# Patient Record
Sex: Female | Born: 1955 | Race: White | Hispanic: No | Marital: Married | Smoking: Never smoker
Health system: Southern US, Community
[De-identification: ages and names within clinical notes are randomized; demographics above are authoritative.]

## PROBLEM LIST (undated history)

## (undated) DIAGNOSIS — F419 Anxiety disorder, unspecified: Secondary | ICD-10-CM

## (undated) DIAGNOSIS — E785 Hyperlipidemia, unspecified: Secondary | ICD-10-CM

## (undated) DIAGNOSIS — R251 Tremor, unspecified: Secondary | ICD-10-CM

## (undated) DIAGNOSIS — I10 Essential (primary) hypertension: Secondary | ICD-10-CM

## (undated) HISTORY — DX: Essential (primary) hypertension: I10

## (undated) HISTORY — PX: CHOLECYSTECTOMY: SHX55

## (undated) HISTORY — DX: Hyperlipidemia, unspecified: E78.5

## (undated) HISTORY — DX: Tremor, unspecified: R25.1

## (undated) HISTORY — DX: Anxiety disorder, unspecified: F41.9

## (undated) HISTORY — PX: DILATION AND CURETTAGE OF UTERUS: SHX78

---

## 2004-12-01 ENCOUNTER — Other Ambulatory Visit: Admission: RE | Admit: 2004-12-01 | Discharge: 2004-12-01 | Payer: Self-pay | Admitting: Obstetrics and Gynecology

## 2005-01-01 ENCOUNTER — Ambulatory Visit (HOSPITAL_COMMUNITY): Admission: RE | Admit: 2005-01-01 | Discharge: 2005-01-01 | Payer: Self-pay | Admitting: Obstetrics and Gynecology

## 2005-01-01 ENCOUNTER — Ambulatory Visit (HOSPITAL_BASED_OUTPATIENT_CLINIC_OR_DEPARTMENT_OTHER): Admission: RE | Admit: 2005-01-01 | Discharge: 2005-01-01 | Payer: Self-pay | Admitting: Obstetrics and Gynecology

## 2005-01-01 ENCOUNTER — Encounter (INDEPENDENT_AMBULATORY_CARE_PROVIDER_SITE_OTHER): Payer: Self-pay | Admitting: *Deleted

## 2006-11-23 HISTORY — PX: LAPAROSCOPIC HYSTERECTOMY: SHX1926

## 2007-06-28 ENCOUNTER — Other Ambulatory Visit: Admission: RE | Admit: 2007-06-28 | Discharge: 2007-06-28 | Payer: Self-pay | Admitting: Obstetrics and Gynecology

## 2007-07-09 ENCOUNTER — Ambulatory Visit (HOSPITAL_COMMUNITY): Admission: RE | Admit: 2007-07-09 | Discharge: 2007-07-09 | Payer: Self-pay | Admitting: Obstetrics and Gynecology

## 2007-07-09 ENCOUNTER — Encounter (INDEPENDENT_AMBULATORY_CARE_PROVIDER_SITE_OTHER): Payer: Self-pay | Admitting: Obstetrics and Gynecology

## 2008-09-04 ENCOUNTER — Other Ambulatory Visit: Admission: RE | Admit: 2008-09-04 | Discharge: 2008-09-04 | Payer: Self-pay | Admitting: Obstetrics and Gynecology

## 2009-02-21 ENCOUNTER — Encounter (INDEPENDENT_AMBULATORY_CARE_PROVIDER_SITE_OTHER): Payer: Self-pay | Admitting: Obstetrics and Gynecology

## 2009-02-21 ENCOUNTER — Ambulatory Visit (HOSPITAL_COMMUNITY): Admission: RE | Admit: 2009-02-21 | Discharge: 2009-02-22 | Payer: Self-pay | Admitting: Obstetrics and Gynecology

## 2009-04-09 ENCOUNTER — Ambulatory Visit: Payer: Self-pay

## 2011-03-04 LAB — CBC
MCHC: 34 g/dL (ref 30.0–36.0)
Platelets: 218 10*3/uL (ref 150–400)
RDW: 13.8 % (ref 11.5–15.5)

## 2011-03-04 LAB — TYPE AND SCREEN
ABO/RH(D): B POS
Antibody Screen: NEGATIVE

## 2011-03-05 LAB — HEPATIC FUNCTION PANEL
AST: 18 U/L (ref 0–37)
Albumin: 4 g/dL (ref 3.5–5.2)
Alkaline Phosphatase: 84 U/L (ref 39–117)
Total Bilirubin: 0.5 mg/dL (ref 0.3–1.2)
Total Protein: 6.7 g/dL (ref 6.0–8.3)

## 2011-03-05 LAB — BASIC METABOLIC PANEL
Calcium: 9.8 mg/dL (ref 8.4–10.5)
Creatinine, Ser: 0.94 mg/dL (ref 0.4–1.2)
GFR calc Af Amer: >60 mL/min (ref >60)
GFR calc non Af Amer: >60 mL/min (ref >60)
Sodium: 135 mEq/L (ref 135–145)

## 2011-03-05 LAB — CBC
Hemoglobin: 13.8 g/dL (ref 12.0–15.0)
RBC: 4.32 MIL/uL (ref 3.87–5.11)

## 2011-04-07 NOTE — Op Note (Signed)
NAME:  Savannah Simon, Savannah Simon                ACCOUNT NO.:  [id number]   MEDICAL RECORD NO.:  [id number]          PATIENT TYPE:  AMB   LOCATION:  SDC                           FACILITY:  [facility]   PHYSICIAN:  Charles A. Delcambre, MDDATE OF BIRTH:  [birth year]   DATE OF PROCEDURE:  07/09/2007  DATE OF DISCHARGE:                               OPERATIVE REPORT   PREOPERATIVE DIAGNOSES:  1. Menorrhagia.  2. Endometrial polyps.   POSTOPERATIVE DIAGNOSES:  1. Menorrhagia.  2. Endometrial polyps.   PROCEDURE:  1. Hysteroscopy.  2. Dilatation and curettage.  3. Polypectomy.  4. Paracervical block.   SURGEON:  Charles A. Sydnee Cabal, M.D.   ASSISTANT:  None.   COMPLICATIONS:  None.   ESTIMATED BLOOD LOSS:  Less than 10 mL.   FINDINGS:  Sound to 8 cm.  Multiple small endometrial polyps noted.  Otherwise __________ cavity.   SPECIMENS:  1. Endometrial polyps.  2. Endometrial curettings.   ANESTHESIA:  General by the laryngeal route.   COUNTS:  Instrument, sponge and needle counts correct x2. General by the  laryngeal route.   FLUIDS LOSS:  Sorbitol loss 200 mL although I would question this.  Some  of this was lost on the floor as the procedure was very brief.   DESCRIPTION OF PROCEDURE:  The patient was taken to the operating room,  placed in the supine position and general anesthetic was given.  She was  then placed in dorsal lithotomy position in Universal stirrups.  Sterile  prep and drape was undertaken.  Anterior lip the cervix was grasped with  a single-tooth tenaculum and cervix passed down to the vaginal introitus  and would not hold a weighted speculum.  There was adequate  visualization at the introitus with the cervix and tenaculum.  Sound was  to 8 cm.  Hanks dilators were used to dilate enough to pass the small 3-  mm scope.  The pelvioscopy was undertaken.  Polyp forceps used to grasp  some small amounts of tissue consistent with polyps, mainly from the  right anterior  lateral wall.  Generalized curetting yielded small amount  of tissue.  There is no evidence of perforation.  The patient tolerated  procedure well.  Tenaculum removed.  Hemostasis was adequate.  She was  given Toradol 30 mg IV, awakened and taken to recovery with physician in  attendance.      Charles A. Sydnee Cabal, MD  Electronically Signed     CAD/MEDQ  D:  07/09/2007  T:  07/10/2007  Job:  [id number]

## 2011-04-07 NOTE — Op Note (Signed)
NAME:  Savannah Simon, Savannah Simon                ACCOUNT NO.:  [id number]   MEDICAL RECORD NO.:  [id number]          PATIENT TYPE:  OIB   LOCATION:  9307                          FACILITY:  [facility]   PHYSICIAN:  Charles A. 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   PROCEDURE:  Laparoscopic-assisted vaginal hysterectomy with bilateral  salpingo-oophorectomy.   SURGEON:  Charles A. Sydnee Cabal, MD   ASSISTANT:  Gerald Leitz, MD.   COMPLICATIONS:  None.   ESTIMATED BLOOD LOSS:  Less than 100 mL.   FINDINGS:  Right ovarian cyst, approximately 3 cm (post menopausal).  Remainder of anatomy normal.   SPECIMEN:  Uterus, tubes, and ovaries to Pathology.   COMPLICATIONS:  None.   Instrument, sponge, and needle count correct x2.   ANESTHESIA:  General by the endotracheal route.   DESCRIPTION OF PROCEDURE:  The patient was taken to the operating room,  placed supine position and anesthesia was given.  She was then placed in  dorsal lithotomy position.  Sterile prep and drape was undertaken.  Cannula was placed on the cervix for uterine manipulation during the  case.  Attention was turned to the abdomen.  A 1-cm incision was made at  the umbilicus after 0.25% Marcaine plain was injected.  With anterior  traction on the abdominal wall, Veress needle was placed without  difficulty or complication.  Aspiration, injection, re-aspiration, and  hanging drop test all indicated intraperitoneal location, pressures less  than 8 mmHg at 4 L per minute high flow all indicated intraperitoneal  location as well, with adequate pneumoperitoneum established at 3 L,  Veress needle was removed.  A 10-mm port was  placed with anterior  traction on the abdominal wall without incident.  Scope was placed  immediately verifying proper placement of the scope and circumferential  view yielded no adhesions noted.  Under direct visualization, a second  port was placed midway down to the symphysis pubis and approximately 10  cm lateral.  Manipulator was placed through this port, steep  Trendelenburg was applied, and patient did have normal anatomy exposed.  A second lower trocar was then placed approximately midway and 10 cm  lateral on the left.  There was no damage to vascular structures, bowel,  or bladder.  Using the Enseal, the infundibulopelvic pedicles were taken  on either side and taken down to the round ligaments, round ligaments  were taken down to the peritoneal reflection of the bladder and the  bladder was taken across with the Enseal and Metzenbaum scissors.  Ureters were well seen easily on right and left and pedicles were  isolated well away from the ureters under clear visualization.  After  the cautery had been done, we were ready to go to the vaginal course of  the case, ureters were noted to be well away from pedicles once again  and peristalsing normally.  Desufflation was done and attention was  turned to the vagina.  Lahey clamps were placed on the cervix.  The  uterus was injected with 1% lidocaine with 1:200 epinephrine and a  scoring incision was made.  Bladder pillars were incised.  Ray-Tec was  packed into the area beneath the bladder to develop the bladder  mobilization off the lower uterine segment.  This did enter peritoneum  as expected and Ray-Tec was removed.  Retractor was placed in the space  and gastric epiploica were seen verifying anterior entry.  Posterior  colpotomy was done without difficulty.  A long weighted speculum was  placed in this space.  Uterosacral ligaments were taken with 0 Vicryl  transfixion stitches and held.  Uterine vessels were taken with   transfixion stitches and held.  Then, final pedicles up were taken to  complete the hysterectomy on either side.  The specimen was removed and  transfixed and stitches were used to close these pedicles.  Hemostasis  was excellent.  Cuff was closed with Richardson angle sutures on either  end and then running locking 0 Vicryl plus one additional figure-of-  eight suture to achieve hemostasis.  Cuff hemostasis was verified.  Attention was taken back to the abdomen.  Scope was replaced,  pneumoperitoneum was established, and the pelvis was dry.  Ureters were  again seen peristalsing on both sides.  Desufflation was allowed to  occur.  There was no bleeding at the trocar sites, low pressures.  All  trocars were removed.  Fascia was then closed at the umbilical area with  0 Vicryl interrupted stitch, 4-0 Vicryl was used to close the skin.  Hemostasis was good.  Hemostasis at the lower sites were excellent.  Dermabond was used to close these incisions.  The patient was awakened  and taken to recovery with physician in attendance having tolerated the  procedure well.      Charles A. Sydnee Cabal, MD  Electronically Signed     CAD/MEDQ  D:  02/21/2009  T:  02/21/2009  Job:  [id number]

## 2011-04-10 NOTE — Op Note (Signed)
NAME:  Savannah Simon, Savannah Simon                ACCOUNT NO.:  [id number]   MEDICAL RECORD NO.:  [id number]          PATIENT TYPE:  AMB   LOCATION:  NESC                         FACILITY:  [facility]   PHYSICIAN:  Daniel L. Gottsegen, M.D.DATE OF BIRTH:  10/23/1956   DATE OF PROCEDURE:  01/01/2005  DATE OF DISCHARGE:                                 OPERATIVE REPORT   PREOPERATIVE DIAGNOSIS:  Dysfunctional uterine bleeding, endometrial polyp.   POSTOPERATIVE DIAGNOSIS:  Dysfunctional uterine bleeding, endometrial polyp.   OPERATION:  Hysteroscopy with excision of endometrial polyp and endometrial  sampling.   SURGEON:  Daniel L. Eda Paschal, M.D.   ANESTHESIA:  General anesthesia.   INDICATIONS FOR PROCEDURE:  The patient is a 55 year old nulligravida who  came to see me because of menomenorrhagia.  She was treated with both  endometrial biopsy which was benign and Megace to stop the bleeding and then  she underwent an SIH.  SIH showed a 2 cm endometrial polyp and she now  enters the hospital for excision of the above.   DESCRIPTION OF PROCEDURE:  After adequate general anesthesia, the patient  was placed in the dorsal lithotomy position, prepped and draped in the usual  sterile manner.  A single-tooth tenaculum was placed in the anterior lip of  the cervix.  The cervix was dilated to #31 [facility] dilator.  Hysteroscopic  resectoscope was utilized, 3% Sorbitol was used to expand the intrauterine  cavity.  Camera was used for magnification.  A 90 degree wire loop set at 70  coag, 110 cutting, blend 1 was utilized.  The intrauterine cavity could  easily be entered and a very large sessile endometrial polyp was seen on the  posterior fundal wall.  This was excised with a 90 degree wire loop using  the above Bovie settings, took five or six different swipes to get the  entire polyp out. Bleeding was controlled with coagulation.  Endometrial  sampling was obtained.  Pictures were taken pre and post excision  of polyp.  At termination of the procedure, there was no bleeding noted.  Blood loss  was less than 50 mL.  Fluid deficit was between 50 and 100 mL.  The patient  tolerated the procedure well and left the operating room in satisfactory  condition.      DLG/MEDQ  D:  01/01/2005  T:  01/01/2005  Job:  [id number]

## 2011-09-07 LAB — BASIC METABOLIC PANEL
CO2: 28
Calcium: 9.6
Chloride: 99
Creatinine, Ser: 0.78
Glucose, Bld: 92
Sodium: 137

## 2011-09-07 LAB — CBC
Hemoglobin: 14.3
MCHC: 35.3
MCV: 92.6
RBC: 4.38
RDW: 12.7

## 2013-03-01 DIAGNOSIS — E663 Overweight: Secondary | ICD-10-CM | POA: Insufficient documentation

## 2013-03-23 DIAGNOSIS — K2100 Gastro-esophageal reflux disease with esophagitis, without bleeding: Secondary | ICD-10-CM | POA: Insufficient documentation

## 2013-10-12 ENCOUNTER — Ambulatory Visit: Payer: Self-pay | Admitting: Family Medicine

## 2013-10-17 ENCOUNTER — Ambulatory Visit: Payer: Self-pay

## 2013-10-25 ENCOUNTER — Ambulatory Visit: Payer: Self-pay

## 2013-11-28 ENCOUNTER — Ambulatory Visit: Payer: Self-pay

## 2013-12-11 ENCOUNTER — Ambulatory Visit: Payer: Self-pay | Admitting: Specialist

## 2014-01-23 ENCOUNTER — Ambulatory Visit: Payer: Self-pay | Admitting: Gastroenterology

## 2014-02-05 ENCOUNTER — Other Ambulatory Visit: Payer: Self-pay | Admitting: Gastroenterology

## 2014-04-20 ENCOUNTER — Ambulatory Visit: Payer: Self-pay | Admitting: Gastroenterology

## 2014-12-12 ENCOUNTER — Ambulatory Visit: Payer: Self-pay | Admitting: Internal Medicine

## 2014-12-24 DIAGNOSIS — K219 Gastro-esophageal reflux disease without esophagitis: Secondary | ICD-10-CM | POA: Insufficient documentation

## 2015-03-27 DIAGNOSIS — R0789 Other chest pain: Secondary | ICD-10-CM | POA: Insufficient documentation

## 2015-03-27 DIAGNOSIS — R739 Hyperglycemia, unspecified: Secondary | ICD-10-CM | POA: Insufficient documentation

## 2015-05-02 ENCOUNTER — Other Ambulatory Visit: Payer: Self-pay | Admitting: Specialist

## 2015-05-02 DIAGNOSIS — R918 Other nonspecific abnormal finding of lung field: Secondary | ICD-10-CM

## 2015-05-09 ENCOUNTER — Ambulatory Visit: Payer: BC Managed Care – PPO | Attending: Specialist

## 2015-06-07 ENCOUNTER — Ambulatory Visit
Admission: RE | Admit: 2015-06-07 | Discharge: 2015-06-07 | Disposition: A | Discharge status: Home / Self Care | Payer: BC Managed Care – PPO | Source: Ambulatory Visit | Attending: Specialist | Admitting: Specialist

## 2015-06-07 DIAGNOSIS — K760 Fatty (change of) liver, not elsewhere classified: Secondary | ICD-10-CM | POA: Diagnosis not present

## 2015-06-07 DIAGNOSIS — R911 Solitary pulmonary nodule: Secondary | ICD-10-CM | POA: Diagnosis present

## 2015-06-07 DIAGNOSIS — R918 Other nonspecific abnormal finding of lung field: Secondary | ICD-10-CM

## 2015-06-20 ENCOUNTER — Inpatient Hospital Stay: Payer: BC Managed Care – PPO | Attending: Cardiothoracic Surgery | Admitting: Cardiothoracic Surgery

## 2015-06-20 ENCOUNTER — Encounter: Payer: Self-pay | Admitting: Cardiothoracic Surgery

## 2015-06-20 VITALS — BP 160/85 | HR 85 | Temp 97.6°F | Ht <= 58 in | Wt 244.4 lb

## 2015-06-20 DIAGNOSIS — Z7689 Persons encountering health services in other specified circumstances: Secondary | ICD-10-CM

## 2015-06-20 DIAGNOSIS — R918 Other nonspecific abnormal finding of lung field: Secondary | ICD-10-CM | POA: Insufficient documentation

## 2015-06-20 DIAGNOSIS — Z0289 Encounter for other administrative examinations: Secondary | ICD-10-CM | POA: Diagnosis not present

## 2015-06-20 NOTE — Progress Notes (Signed)
Patient ID: Savannah Simon, female   DOB: 23-Feb-1956, 59 y.o.   MRN: [id number]  Chief Complaint  Patient presents with  . Lung Cancer    referral from [facility] for lung nodule    Referred By Dr. Raul Del Reason for Referral right lower lobe lung mass  HPI Location, Quality, Duration, Severity, Timing, Context, Modifying Factors, Associated Signs and Symptoms.  Savannah Simon is a 59 y.o. female.  I have personally seen and examined this patient. I have independently reviewed her x-rays including her CT scans.  This patient is a 59 year old white female who was born and raised in [place] and recently relocated to [place] after following her husband's employment opportunities. Her problems began about a year and a half ago when she experienced an episode of coughing for which she initially was diagnosed with a angiotensin-converting burning inhibitor cough.  A change in her hypertensive medications did not result in any significant improvement and she was ultimately referred to Dr. Raul Del after a chest x-ray confirmed the presence of a small pulmonary nodule. A subsequent CT scan confirmed the presence of calcified lymph nodes within the mediastinum as well as a calcified nodule in her left lower lobe. In the right lower lobe is a noncalcified 5-6 mm nodule which upon further follow-up has now increased to 8 mm over the last year and a half. The patient initially was started on Pulmicort and this did not really improve her symptoms. However she did notice that there was some mold the ceiling tiles at her place of work and these were replaced with some improvement in her symptoms. She had also been tried on famotidine which did not help her cough. Ultimately her cough has resolved and she has had no further problems. She states she only gets short of breath when she exercises a great deal in the heat. She does not complain of any weight loss fevers chills night sweats or other pulmonary symptoms. She was  complaining of some upper chest tightness and a stress test was performed which did not reveal any evidence of myocardial ischemia. She presents here today for follow-up of her lung nodule and surgical considerations.   Past Medical History  Diagnosis Date  . Hypertension     Past Surgical History  Procedure Laterality Date  . Cholecystectomy      No family history on file.  Social History History  Substance Use Topics  . Smoking status: Not on file  . Smokeless tobacco: Not on file  . Alcohol Use: Not on file    No Known Allergies  Current Outpatient Prescriptions  Medication Sig Dispense Refill  . famotidine (PEPCID) 40 MG tablet 40 mg.    . hydrochlorothiazide (HYDRODIURIL) 25 MG tablet 25 mg.    . metoprolol succinate (TOPROL-XL) 25 MG 24 hr tablet 25 mg.    . Diclofenac-Misoprostol 75-0.2 MG TBEC Take 75 mg by mouth.    . Multiple Vitamins-Minerals (CENTRUM ADULTS PO) Take by mouth.    . Omega-3 Fatty Acids (FISH OIL) 1000 MG CAPS Take 227 mg by mouth.     No current facility-administered medications for this visit.      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 She also complains of some swelling in her lower extremities particularly with [place] sedentary lifestyle but this resolves quickly with leg elevation and ambulation. She does complain of  some shortness of breath and the heat but otherwise is not short of breath.  Blood pressure 160/85, pulse 85, temperature 97.6 F (36.4 C), temperature source Tympanic, height 2' 1.59" (0.65 m), weight 244 lb 6.1 oz (110.85 kg), SpO2 98 %.  Physical Exam CONSTITUTIONAL:  Pleasant, well-developed, well-nourished, and in no acute distress. EYES: Pupils equal and reactive to light, Sclera non-icteric EARS, NOSE, MOUTH AND THROAT:  The oropharynx was clear.  Dentition is good  repair.  Oral mucosa pink and moist. LYMPH NODES:  Lymph nodes in the neck and axillae were normal RESPIRATORY:  Lungs were clear.  Normal respiratory effort without pathologic use of accessory muscles of respiration CARDIOVASCULAR: Heart was regular without murmurs.  There were no carotid bruits. GI: The abdomen was soft, nontender, and nondistended. There were no palpable masses. There was no hepatosplenomegaly. There were normal bowel sounds in all quadrants. GU:  Rectal deferred.   MUSCULOSKELETAL:  Normal muscle strength and tone.  No clubbing or cyanosis.   SKIN:  There were no pathologic skin lesions.  There were no nodules on palpation. NEUROLOGIC:  Sensation is normal.  Cranial nerves are grossly intact. PSYCH:  Oriented to person, place and time.  Mood and affect are normal.  Data Reviewed I have personally reviewed the CT scans chest x-rays  I have personally reviewed the patient's imaging, laboratory findings and medical records.    Assessment    The CT scan shows a noncalcified nodule in the right lower lobe which has increased in size from 5-6 mm to approximately 8 mm over the last year and a half. 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    Plan    I had a long discussion with the patient today regarding the options. I reviewed with her the options of repeat CT scan in 6 months, PET scan, fine-needle aspirate and surgical resection. We discussed the advantages and disadvantages of all these options. I told her that the only way to be absolute sure the diagnosis would be surgical resection. She is uncomfortable with that approach at the present time and would like to have additional follow-up performed in 6 months. She states that she is scheduled to follow-up with you in the next week or 2 and she will inform you to schedule the CT scan 22-year-old office in 6 months that she would like to continue her  follow-up with you. I thought this would be the best for her as you can also manage her other comorbid conditions. I did not make him an appointment for her at this point but be happy to see her again you think that would be necessary.       Nestor Lewandowsky, MD 06/20/2015, 9:56 AM

## 2015-06-21 ENCOUNTER — Encounter: Payer: Self-pay | Admitting: *Deleted

## 2015-06-21 NOTE — Progress Notes (Signed)
  Oncology Nurse Navigator Documentation    Navigator Encounter Type: Clinic/[facility]C (06/20/15 1547)               Met with patient at initial thoracic surgery appointment. Introduced Programmer, multimedia and reviewed plan of care. Will follow if needed. Plan for followup with pulmonary physician.

## 2015-11-22 IMAGING — CT CT CHEST W/O CM
2 of 4 series · 15 of 36 positions shown, 18 images · non-contrast
Comparison: 10/25/2013 chest CT.

CLINICAL DATA: Chronic cough for 1 year. Followup right lower lobe
pulmonary nodule.

EXAM:
CT CHEST WITHOUT CONTRAST
TECHNIQUE: Multidetector CT imaging of the chest was performed following the
standard protocol without IV contrast.

[Series 2: routine chest wo · axial · 0.78mm/px · z∈[-201,+94]mm · 12 of 71 slices shown, 15 images]
[im 6/71  mediastinal]
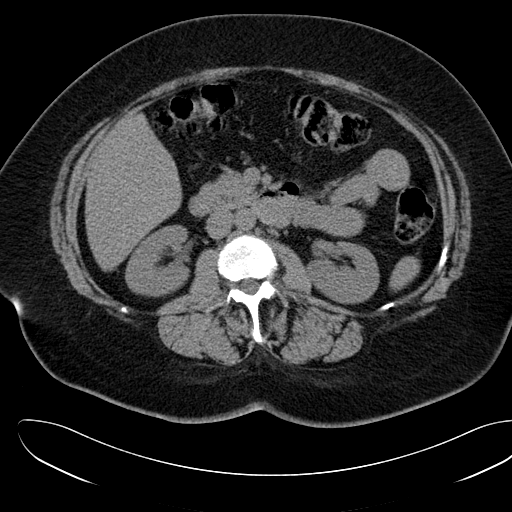
[im 6/71  lung]
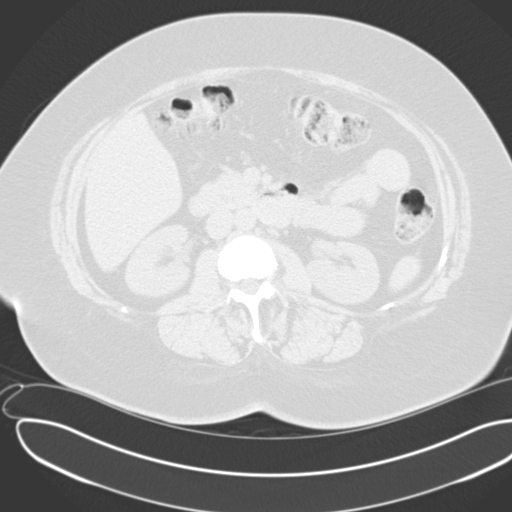
[im 11/71  lung]
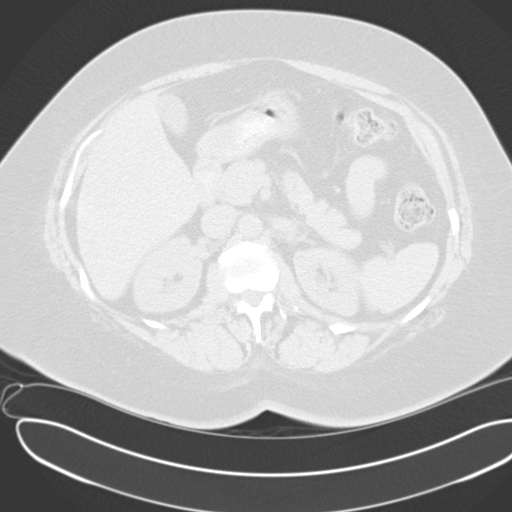
[im 17/71  lung]
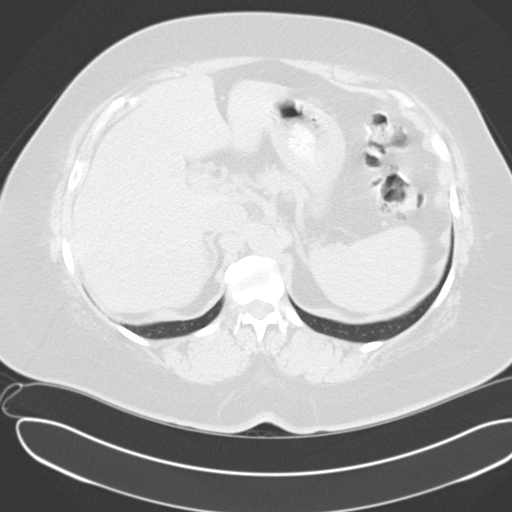
[im 22/71  lung]
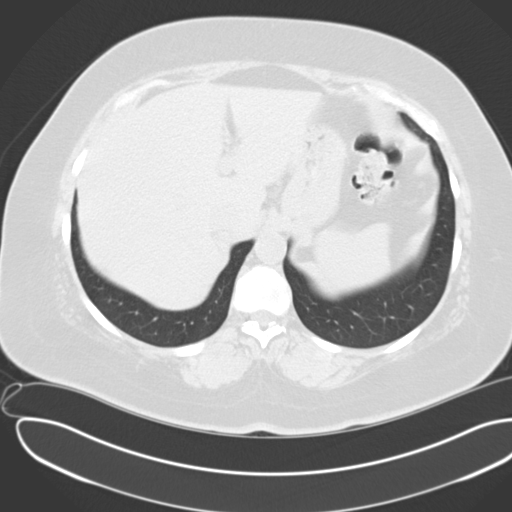
[im 27/71  mediastinal]
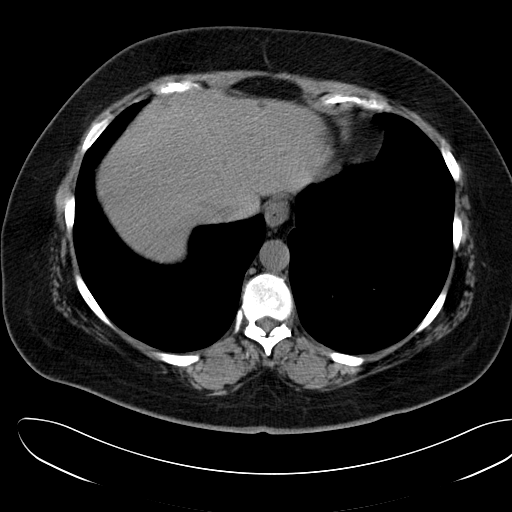
[im 27/71  lung]
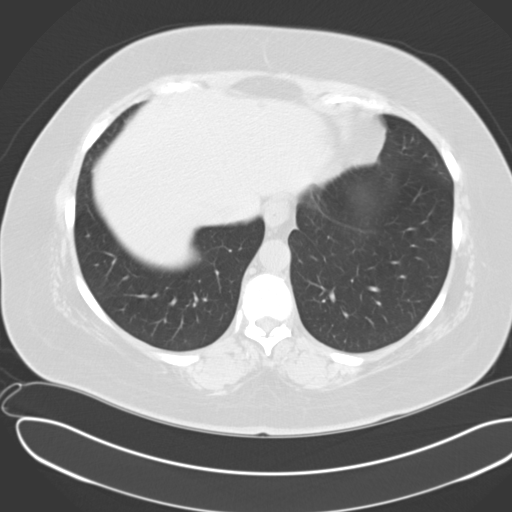
[im 33/71  lung]
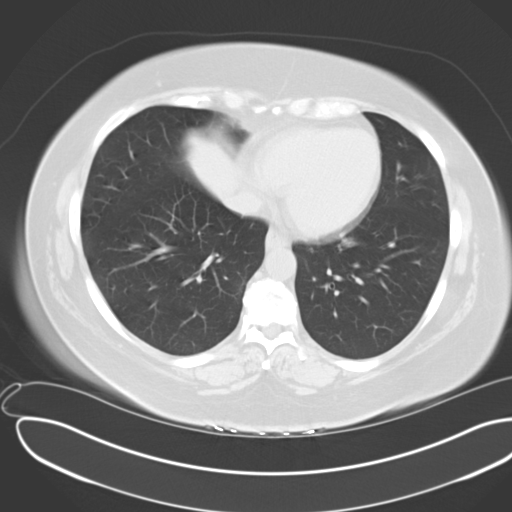
[im 38/71  lung]
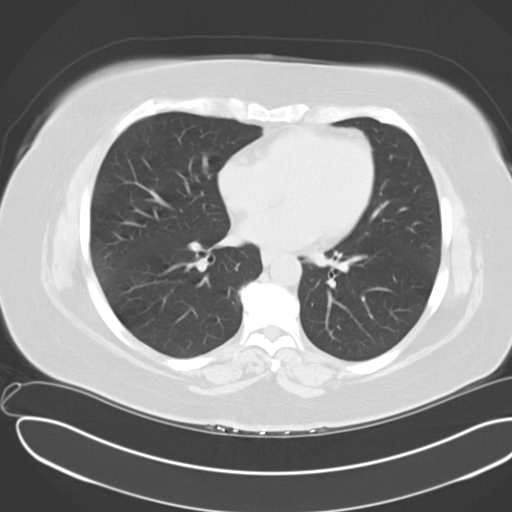
[im 44/71  lung]
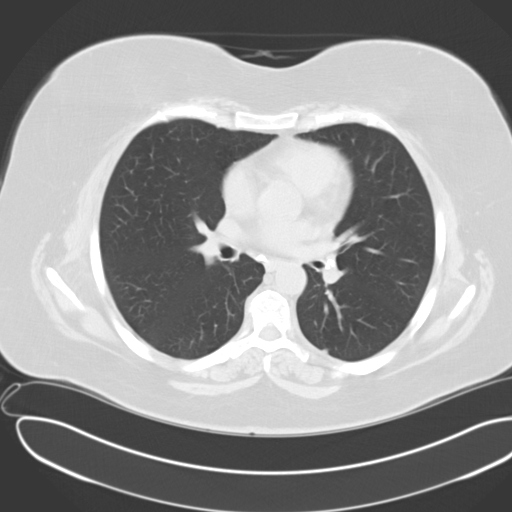
[im 49/71  mediastinal]
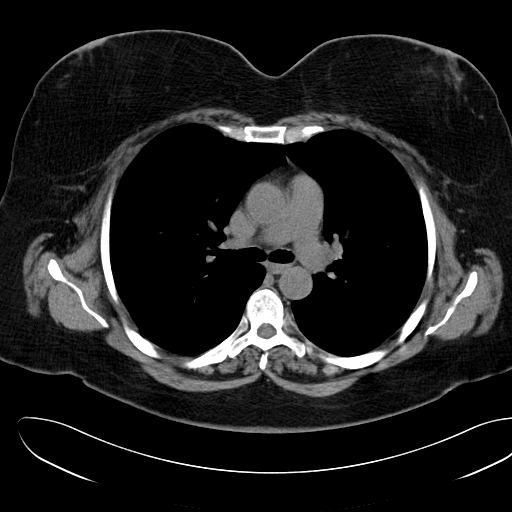
[im 49/71  lung]
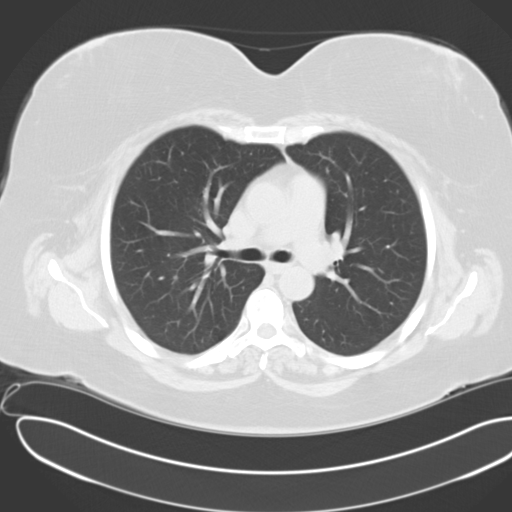
[im 54/71  lung]
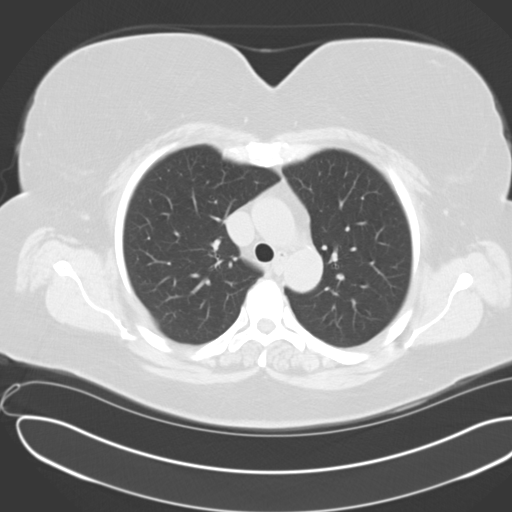
[im 60/71  lung]
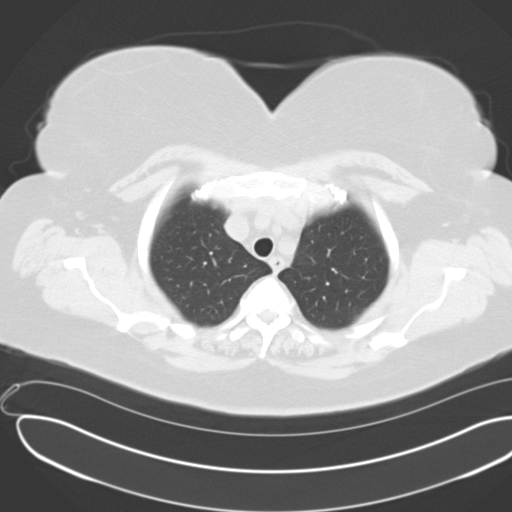
[im 65/71  lung]
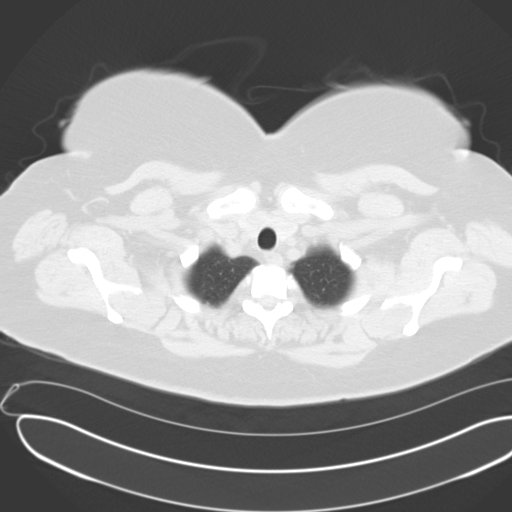

[Series 5: cor routine chest wo · coronal · 0.68mm/px · 3 of 126 slices shown]
[im 26/126  lung]
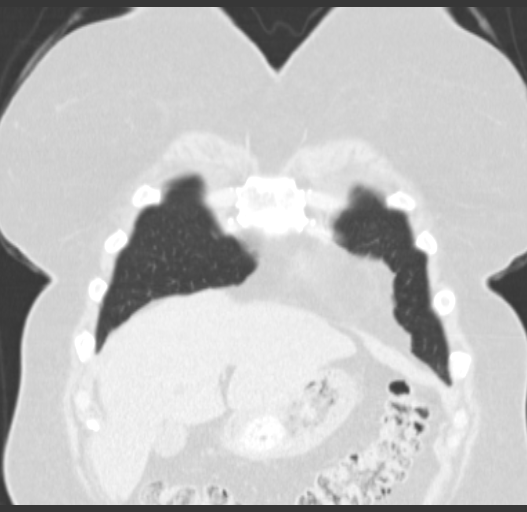
[im 51/126  lung]
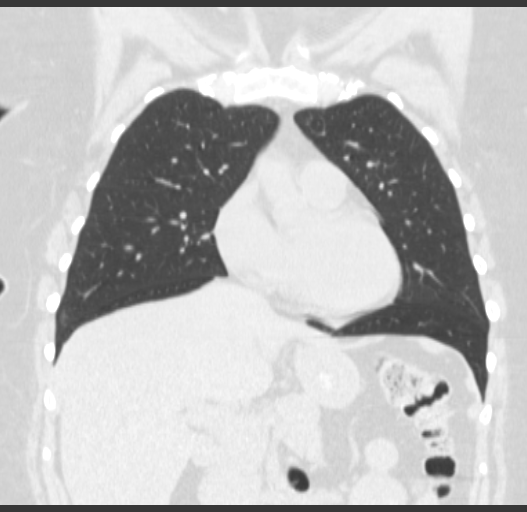
[im 76/126  lung]
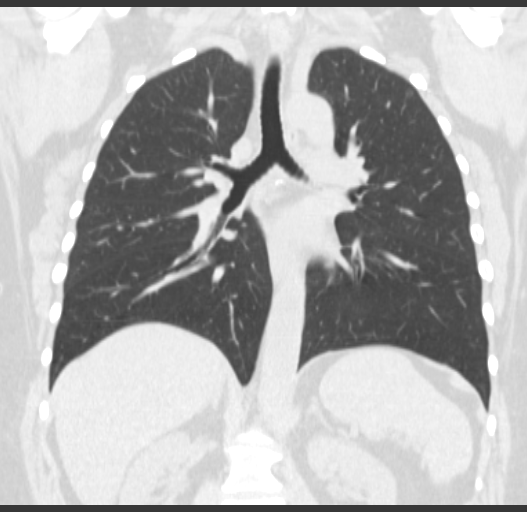

[15 of 36 positions shown; findings below may reference images not displayed]

FINDINGS: Wedge-shaped 3 mm right upper lobe pleural parenchymal scarring or
atelectasis image 22 incidentally noted. Stable 5 mm right lower
lobe pulmonary nodule image 37. There is a suggestion of possible
punctate calcification peripherally which could indicate a non
calcified granuloma. Calcified left lower lobe granuloma
reidentified. No new pulmonary consolidation or mass. No pleural or
pericardial effusion. 3 mm subpleural left lower lobe nodule image
28 is new from the prior exam. Central airways are patent.
Subcarinal and left hilar non enlarged calcified lymph nodes
reidentified. No new lymphadenopathy.

Thyroid is mildly inhomogeneous but otherwise unremarkable. Great
vessels are normal in caliber. Heart size is normal. Incomplete
imaging of the upper abdomen re- demonstrates normal-appearing
adrenal glands. Left hepatic lobe cysts reidentified. No acute
osseous abnormality. Disc degenerative change reidentified.
IMPRESSION: New subpleural 3 mm nodules as above, which could indicate
atelectasis or scarring but are amenable to followup at the time of
chest CT October 2014 for followup of stable right lower lobe 5 mm
pulmonary parenchymal nodule.

## 2015-12-30 ENCOUNTER — Other Ambulatory Visit: Payer: Self-pay | Admitting: Specialist

## 2015-12-30 DIAGNOSIS — R911 Solitary pulmonary nodule: Secondary | ICD-10-CM

## 2016-01-08 ENCOUNTER — Ambulatory Visit
Admission: RE | Admit: 2016-01-08 | Discharge: 2016-01-08 | Disposition: A | Discharge status: Home / Self Care | Payer: BC Managed Care – PPO | Source: Ambulatory Visit | Attending: Specialist | Admitting: Specialist

## 2016-01-08 DIAGNOSIS — K449 Diaphragmatic hernia without obstruction or gangrene: Secondary | ICD-10-CM | POA: Diagnosis not present

## 2016-01-08 DIAGNOSIS — K760 Fatty (change of) liver, not elsewhere classified: Secondary | ICD-10-CM | POA: Insufficient documentation

## 2016-01-08 DIAGNOSIS — R911 Solitary pulmonary nodule: Secondary | ICD-10-CM | POA: Diagnosis not present

## 2016-01-16 ENCOUNTER — Other Ambulatory Visit: Payer: Self-pay | Admitting: Specialist

## 2016-01-16 DIAGNOSIS — R911 Solitary pulmonary nodule: Secondary | ICD-10-CM

## 2016-07-07 ENCOUNTER — Ambulatory Visit
Admission: RE | Admit: 2016-07-07 | Discharge: 2016-07-07 | Disposition: A | Discharge status: Home / Self Care | Payer: BC Managed Care – PPO | Source: Ambulatory Visit | Attending: Specialist | Admitting: Specialist

## 2016-07-07 DIAGNOSIS — K449 Diaphragmatic hernia without obstruction or gangrene: Secondary | ICD-10-CM | POA: Insufficient documentation

## 2016-07-07 DIAGNOSIS — R911 Solitary pulmonary nodule: Secondary | ICD-10-CM | POA: Diagnosis present

## 2016-07-07 DIAGNOSIS — K760 Fatty (change of) liver, not elsewhere classified: Secondary | ICD-10-CM | POA: Diagnosis not present

## 2016-07-23 ENCOUNTER — Other Ambulatory Visit: Payer: Self-pay | Admitting: Internal Medicine

## 2016-07-23 DIAGNOSIS — F419 Anxiety disorder, unspecified: Secondary | ICD-10-CM | POA: Insufficient documentation

## 2016-07-23 DIAGNOSIS — Z1239 Encounter for other screening for malignant neoplasm of breast: Secondary | ICD-10-CM

## 2016-08-18 ENCOUNTER — Ambulatory Visit: Payer: BC Managed Care – PPO | Attending: Internal Medicine

## 2017-09-30 ENCOUNTER — Other Ambulatory Visit: Payer: Self-pay | Admitting: Internal Medicine

## 2017-09-30 DIAGNOSIS — F411 Generalized anxiety disorder: Secondary | ICD-10-CM | POA: Insufficient documentation

## 2017-09-30 DIAGNOSIS — G250 Essential tremor: Secondary | ICD-10-CM | POA: Insufficient documentation

## 2017-09-30 DIAGNOSIS — Z1231 Encounter for screening mammogram for malignant neoplasm of breast: Secondary | ICD-10-CM

## 2017-10-26 ENCOUNTER — Ambulatory Visit
Admission: RE | Admit: 2017-10-26 | Discharge: 2017-10-26 | Disposition: A | Discharge status: Home / Self Care | Payer: BC Managed Care – PPO | Source: Ambulatory Visit | Attending: Internal Medicine | Admitting: Internal Medicine

## 2017-10-26 DIAGNOSIS — Z1231 Encounter for screening mammogram for malignant neoplasm of breast: Secondary | ICD-10-CM | POA: Diagnosis present

## 2018-09-26 ENCOUNTER — Other Ambulatory Visit: Payer: Self-pay | Admitting: Internal Medicine

## 2018-09-26 DIAGNOSIS — Z1231 Encounter for screening mammogram for malignant neoplasm of breast: Secondary | ICD-10-CM

## 2018-10-11 ENCOUNTER — Encounter: Payer: Self-pay | Admitting: Family Medicine

## 2018-10-11 ENCOUNTER — Ambulatory Visit: Payer: BC Managed Care – PPO | Admitting: Family Medicine

## 2018-10-11 VITALS — BP 126/70 | HR 82 | Temp 98.3°F | Ht 65.0 in | Wt 253.0 lb

## 2018-10-11 DIAGNOSIS — Z7689 Persons encountering health services in other specified circumstances: Secondary | ICD-10-CM | POA: Diagnosis not present

## 2018-10-11 DIAGNOSIS — R600 Localized edema: Secondary | ICD-10-CM | POA: Diagnosis not present

## 2018-10-11 DIAGNOSIS — R7303 Prediabetes: Secondary | ICD-10-CM | POA: Diagnosis not present

## 2018-10-11 DIAGNOSIS — I10 Essential (primary) hypertension: Secondary | ICD-10-CM

## 2018-10-11 MED ORDER — LOSARTAN POTASSIUM 100 MG PO TABS: 100.0000 mg | ORAL_TABLET | Freq: Every day | ORAL | 3 refills | Status: DC

## 2018-10-11 NOTE — Progress Notes (Signed)
Patient presents to clinic today to establish care.  SUBJECTIVE: PMH: Pt is a 62 yo female with pmh sig for HTN, preDM.    HTN: -taking Norvasc 10 mg daily since 2015 -endorses LE edema, worse in evenings. -doing some walking at work -not eating much salt, given her husband's health  PreDM: -not on meds -changed diet.  Allergies: NKDA  Social hx: Pt is married.Marland Kitchen Her husband is also seen by this provider.  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  Pt and her husband live in [place] for part of the yr.  Pt denies tobacco and drug use.  Past Medical History:  Diagnosis Date  . Hypertension     Past Surgical History:  Procedure Laterality Date  . CHOLECYSTECTOMY      Current Outpatient Medications on File Prior to Visit  Medication Sig Dispense Refill  . famotidine (PEPCID) 40 MG tablet 40 mg.    . losartan-hydrochlorothiazide (HYZAAR) 50-12.5 MG tablet Take 1 tablet by mouth daily.    . metoprolol succinate (TOPROL-XL) 25 MG 24 hr tablet 25 mg.    . Multiple Vitamins-Minerals (CENTRUM ADULTS PO) Take by mouth.    . propranolol (INDERAL) 20 MG tablet Take 20 mg by mouth daily.     No current facility-administered medications on file prior to visit.     No Known Allergies  Family History  Problem Relation Age of Onset  . Cancer Mother 90       unknown primary     Social History   Socioeconomic History  . Marital status: Married    Spouse name: Not on file  . Number of children: Not on file  . Years of education: Not on file  . Highest education level: Not on file  Occupational History  . Not on file  Social Needs  . Financial resource strain: Not on file  . Food insecurity:    Worry: Not on file    Inability: Not on file  . Transportation needs:    Medical: Not on file    Non-medical: Not on file  Tobacco Use  . Smoking status: Not on file  Substance and Sexual Activity  . Alcohol use: Not on file  . Drug use: Not on  file  . Sexual activity: Not on file  Lifestyle  . Physical activity:    Days per week: Not on file    Minutes per session: Not on file  . Stress: Not on file  Relationships  . Social connections:    Talks on phone: Not on file    Gets together: Not on file    Attends religious service: Not on file    Active member of club or organization: Not on file    Attends meetings of clubs or organizations: Not on file    Relationship status: Not on file  . Intimate partner violence:    Fear of current or ex partner: Not on file    Emotionally abused: Not on file    Physically abused: Not on file    Forced sexual activity: Not on file  Other Topics Concern  . Not on file  Social History Narrative  . Not on file    ROS General: Denies fever, chills, night sweats, changes in weight, changes in appetite HEENT: Denies headaches, ear pain, changes in vision, rhinorrhea, sore throat CV: Denies CP, palpitations, SOB, orthopnea Pulm: Denies SOB, cough, wheezing GI: Denies abdominal pain, nausea, vomiting, diarrhea, constipation  GU: Denies dysuria, hematuria, frequency, vaginal discharge Msk: Denies muscle cramps, joint pains  +LE edema Neuro: Denies weakness, numbness, tingling Skin: Denies rashes, bruising Psych: Denies depression, anxiety, hallucinations  BP 126/70 (BP Location: Left Arm, Patient Position: Sitting, Cuff Size: Large)   Pulse 82   Temp 98.3 F (36.8 C) (Oral)   Ht 5\' 5"  (1.651 m)   Wt 253 lb (114.8 kg)   SpO2 97%   BMI 42.10 kg/m   Physical Exam Gen. Pleasant, well developed, well-nourished, in NAD HEENT - [place]/AT, PERRL, no scleral icterus, no nasal drainage, pharynx without erythema or exudate. Lungs: no use of accessory muscles, CTAB, no wheezes, rales or rhonchi Cardiovascular: RRR, no r/g/m, no peripheral edema Musculoskeletal: No deformities, moves all four extremities, no cyanosis or clubbing, normal tone Neuro:  A&Ox3, CN II-XII intact, normal gait Skin:   Warm, dry, intact, no lesions  No results found for this or any previous visit (from the past 2160 hour(s)).  Assessment/Plan: Essential hypertension  -controlled. -given increased LE edema discussed d/c'ing norvasc as may be contributing.  Pt in agrees to this plan - Plan: losartan (COZAAR) 100 MG tablet  Prediabetes -continue to monitor -lifestyle modifications  Bilateral lower extremity edema -discussed elevating LEs when sitting -compression socks or TED hose -will d/c norvasc at may be contributing -continue lifestyle modifications  Encounter to establish care -We reviewed the PMH, PSH, FH, SH, Meds and Allergies. -We provided refills for any medications we will prescribe as needed. -We addressed current concerns per orders and patient instructions. -We have asked for records for pertinent exams, studies, vaccines and notes from previous providers. -We have advised patient to follow up per instructions below.  F/u prn in 1 month for BP and edema, sooner if needed.  [name], MD

## 2018-10-11 NOTE — Patient Instructions (Addendum)
How to Take Your Blood Pressure You can take your blood pressure at home with a machine. You may need to check your blood pressure at home:  To check if you have high blood pressure (hypertension).  To check your blood pressure over time.  To make sure your blood pressure medicine is working.  Supplies needed: You will need a blood pressure machine, or monitor. You can buy one at a drugstore or online. When choosing one:  Choose one with an arm cuff.  Choose one that wraps around your upper arm. Only one finger should fit between your arm and the cuff.  Do not choose one that measures your blood pressure from your wrist or finger.  Your doctor can suggest a monitor. How to prepare Avoid these things for 30 minutes before checking your blood pressure:  Drinking caffeine.  Drinking alcohol.  Eating.  Smoking.  Exercising.  Five minutes before checking your blood pressure:  Pee.  Sit in a dining chair. Avoid sitting in a soft couch or armchair.  Be quiet. Do not talk.  How to take your blood pressure Follow the instructions that came with your machine. If you have a digital blood pressure monitor, these may be the instructions: 1. Sit up straight. 2. Place your feet on the floor. Do not cross your ankles or legs. 3. Rest your left arm at the level of your heart. You may rest it on a table, desk, or chair. 4. Pull up your shirt sleeve. 5. Wrap the blood pressure cuff around the upper part of your left arm. The cuff should be 1 inch (2.5 cm) above your elbow. It is best to wrap the cuff around bare skin. 6. Fit the cuff snugly around your arm. You should be able to place only one finger between the cuff and your arm. 7. Put the cord inside the groove of your elbow. 8. Press the power button. 9. Sit quietly while the cuff fills with air and loses air. 10. Write down the numbers on the screen. 11. Wait 2-3 minutes and then repeat steps 1-10.  What do the numbers  mean? Two numbers make up your blood pressure. The first number is called systolic pressure. The second is called diastolic pressure. An example of a blood pressure reading is "120 over 80" (or 120/80). If you are an adult and do not have a medical condition, use this guide to find out if your blood pressure is normal: Normal  First number: below 120.  Second number: below 80. Elevated  First number: 120-129.  Second number: below 80. Hypertension stage 1  First number: 130-139.  Second number: 80-89. Hypertension stage 2  First number: 140 or above.  Second number: 90 or above. Your blood pressure is above normal even if only the top or bottom number is above normal. Follow these instructions at home:  Check your blood pressure as often as your doctor tells you to.  Take your monitor to your next doctor's appointment. Your doctor will: ? Make sure you are using it correctly. ? Make sure it is working right.  Make sure you understand what your blood pressure numbers should be.  Tell your doctor if your medicines are causing side effects. Contact a doctor if:  Your blood pressure keeps being high. Get help right away if:  Your first blood pressure number is higher than 180.  Your second blood pressure number is higher than 120. This information is not intended to replace advice given   to you by your health care provider. Make sure you discuss any questions you have with your health care provider. Document Released: 10/22/2008 Document Revised: 10/07/2016 Document Reviewed: 04/17/2016 Elsevier Interactive Patient Education  2018 [employer].  Peripheral Edema Peripheral edema is swelling that is caused by a buildup of fluid. Peripheral edema most often affects the lower legs, ankles, and feet. It can also develop in the arms, hands, and face. The area of the body that has peripheral edema will look swollen. It may also feel heavy or warm. Your clothes may start to feel  tight. Pressing on the area may make a temporary dent in your skin. You may not be able to move your arm or leg as much as usual. There are many causes of peripheral edema. It can be a complication of other diseases, such as congestive heart failure, kidney disease, or a problem with your blood circulation. It also can be a side effect of certain medicines. It often happens to women during pregnancy. Sometimes, the cause is not known. Treating the underlying condition is often the only treatment for peripheral edema. Follow these instructions at home: Pay attention to any changes in your symptoms. Take these actions to help with your discomfort:  Raise (elevate) your legs while you are sitting or lying down.  Move around often to prevent stiffness and to lessen swelling. Do not sit or stand for long periods of time.  Wear support stockings as told by your health care provider.  Follow instructions from your health care provider about limiting salt (sodium) in your diet. Sometimes eating less salt can reduce swelling.  Take over-the-counter and prescription medicines only as told by your health care provider. Your health care provider may prescribe medicine to help your body get rid of excess water (diuretic).  Keep all follow-up visits as told by your health care provider. This is important.  Contact a health care provider if:  You have a fever.  Your edema starts suddenly or is getting worse, especially if you are pregnant or have a medical condition.  You have swelling in only one leg.  You have increased swelling and pain in your legs. Get help right away if:  You develop shortness of breath, especially when you are lying down.  You have pain in your chest or abdomen.  You feel weak.  You faint. This information is not intended to replace advice given to you by your health care provider. Make sure you discuss any questions you have with your health care provider. Document  Released: 12/17/2004 Document Revised: 04/13/2016 Document Reviewed: 05/22/2015 Elsevier Interactive Patient Education  2018 [employer].  Preventing Type 2 Diabetes Mellitus Type 2 diabetes (type 2 diabetes mellitus) is a long-term (chronic) disease that affects blood sugar (glucose) levels. Normally, a hormone called insulin allows glucose to enter cells in the body. The cells use glucose for energy. In type 2 diabetes, one or both of these problems may be present:  The body does not make enough insulin.  The body does not respond properly to insulin that it makes (insulin resistance).  Insulin resistance or lack of insulin causes excess glucose to build up in the blood instead of going into cells. As a result, high blood glucose (hyperglycemia) develops, which can cause many complications. Being overweight or obese and having an inactive (sedentary) lifestyle can increase your risk for diabetes. Type 2 diabetes can be delayed or prevented by making certain nutrition and lifestyle changes. What nutrition changes can  be made?  Eat healthy meals and snacks regularly. Keep a healthy snack with you for when you get hungry between meals, such as fruit or a handful of nuts.  Eat lean meats and proteins that are low in saturated fats, such as chicken, fish, egg whites, and beans. Avoid processed meats.  Eat plenty of fruits and vegetables and plenty of grains that have not been processed (whole grains). It is recommended that you eat: ? 1?2 cups of fruit every day. ? 2?3 cups of vegetables every day. ? 6?8 oz of whole grains every day, such as oats, whole wheat, bulgur, brown rice, quinoa, and millet.  Eat low-fat dairy products, such as milk, yogurt, and cheese.  Eat foods that contain healthy fats, such as nuts, avocado, olive oil, and canola oil.  Drink water throughout the day. Avoid drinks that contain added sugar, such as soda or sweet tea.  Follow instructions from your health care  provider about specific eating or drinking restrictions.  Control how much food you eat at a time (portion size). ? Check food labels to find out the serving sizes of foods. ? Use a kitchen scale to weigh amounts of foods.  Saute or steam food instead of frying it. Cook with water or broth instead of oils or butter.  Limit your intake of: ? Salt (sodium). Have no more than 1 tsp (2,400 mg) of sodium a day. If you have heart disease or high blood pressure, have less than ? tsp (1,500 mg) of sodium a day. ? Saturated fat. This is fat that is solid at room temperature, such as butter or fat on meat. What lifestyle changes can be made?  Activity  Do moderate-intensity physical activity for at least 30 minutes on at least 5 days of the week, or as much as told by your health care provider.  Ask your health care provider what activities are safe for you. A mix of physical activities may be best, such as walking, swimming, cycling, and strength training.  Try to add physical activity into your day. For example: ? Park in spots that are farther away than usual, so that you walk more. For example, park in a far corner of the parking lot when you go to the office or the grocery store. ? Take a walk during your lunch break. ? Use stairs instead of elevators or escalators. Weight Loss  Lose weight as directed. Your health care provider can determine how much weight loss is best for you and can help you lose weight safely.  If you are overweight or obese, you may be instructed to lose at least 5?7 % of your body weight. Alcohol and Tobacco   Limit alcohol intake to no more than 1 drink a day for nonpregnant women and 2 drinks a day for men. One drink equals 12 oz of beer, 5 oz of wine, or 1 oz of hard liquor.  Do not use any tobacco products, such as cigarettes, chewing tobacco, and e-cigarettes. If you need help quitting, ask your health care provider. Work With [facility]  Provider  Have your blood glucose tested regularly, as told by your health care provider.  Discuss your risk factors and how you can reduce your risk for diabetes.  Get screening tests as told by your health care provider. You may have screening tests regularly, especially if you have certain risk factors for type 2 diabetes.  Make an appointment with a diet and nutrition specialist (registered dietitian).  A registered dietitian can help you make a healthy eating plan and can help you understand portion sizes and food labels. Why are these changes important?  It is possible to prevent or delay type 2 diabetes and related health problems by making lifestyle and nutrition changes.  It can be difficult to recognize signs of type 2 diabetes. The best way to avoid possible damage to your body is to take actions to prevent the disease before you develop symptoms. What can happen if changes are not made?  Your blood glucose levels may keep increasing. Having high blood glucose for a long time is dangerous. Too much glucose in your blood can damage your blood vessels, heart, kidneys, nerves, and eyes.  You may develop prediabetes or type 2 diabetes. Type 2 diabetes can lead to many chronic health problems and complications, such as: ? Heart disease. ? Stroke. ? Blindness. ? Kidney disease. ? Depression. ? Poor circulation in the feet and legs, which could lead to surgical removal (amputation) in severe cases. Where to find support:  Ask your health care provider to recommend a registered dietitian, diabetes educator, or weight loss program.  Look for local or online weight loss groups.  Join a gym, fitness club, or outdoor activity group, such as a walking club. Where to find more information: To learn more about diabetes and diabetes prevention, visit:  American Diabetes Association (ADA): www.diabetes.[employer] of Diabetes and Digestive and Kidney Diseases:  FindSpin.nl  To learn more about healthy eating, visit:  The U.S. Department of Agriculture Scientist, research (physical sciences)), Choose My Plate: http://wiley-williams.com/  Office of Disease Prevention and Health Promotion (ODPHP), Dietary Guidelines: SurferLive.at  Summary  You can reduce your risk for type 2 diabetes by increasing your physical activity, eating healthy foods, and losing weight as directed.  Talk with your health care provider about your risk for type 2 diabetes. Ask about any blood tests or screening tests that you need to have. This information is not intended to replace advice given to you by your health care provider. Make sure you discuss any questions you have with your health care provider. Document Released: 03/02/2016 Document Revised: 04/16/2016 Document Reviewed: 12/31/2015 Elsevier Interactive Patient Education  [employer].

## 2018-10-14 ENCOUNTER — Encounter: Payer: Self-pay | Admitting: Family Medicine

## 2018-11-10 ENCOUNTER — Encounter: Payer: Self-pay | Admitting: Family Medicine

## 2018-11-10 ENCOUNTER — Ambulatory Visit: Payer: BC Managed Care – PPO | Admitting: Family Medicine

## 2018-11-10 VITALS — BP 110/64 | HR 70 | Temp 98.2°F | Wt 256.0 lb

## 2018-11-10 DIAGNOSIS — I10 Essential (primary) hypertension: Secondary | ICD-10-CM | POA: Diagnosis not present

## 2018-11-10 MED ORDER — LOSARTAN POTASSIUM 100 MG PO TABS: 100.0000 mg | ORAL_TABLET | Freq: Every day | ORAL | 3 refills | Status: DC

## 2018-11-10 NOTE — Progress Notes (Signed)
Subjective:    Patient ID: Savannah Simon, female    DOB: [birth year], 62 y.o.   MRN: [id number]  No chief complaint on file.   HPI Patient was seen today for follow-up on chronic conditions.  HTN: -taking losartan 100 mg daily,  HCTZ 12.5 -Was previously on Norvasc 10 mg which was d/c'd 2/2 LE edema  -pt notes improvement in edema -BP controlled at home -denies issues with new BP med  Past Medical History:  Diagnosis Date  . Hypertension     No Known Allergies  ROS General: Denies fever, chills, night sweats, changes in weight, changes in appetite HEENT: Denies headaches, ear pain, changes in vision, rhinorrhea, sore throat CV: Denies CP, palpitations, SOB, orthopnea Pulm: Denies SOB, cough, wheezing GI: Denies abdominal pain, nausea, vomiting, diarrhea, constipation GU: Denies dysuria, hematuria, frequency, vaginal discharge Msk: Denies muscle cramps, joint pains Neuro: Denies weakness, numbness, tingling Skin: Denies rashes, bruising Psych: Denies depression, anxiety, hallucinations     Objective:    Blood pressure 110/64, pulse 70, temperature 98.2 F (36.8 C), temperature source Oral, weight 256 lb (116.1 kg), SpO2 97 %.  Gen. Pleasant, well-nourished, in no distress, normal affect  HEENT: [place]/AT, face symmetric, no scleral icterus, PERRLA, nares patent without drainage, pharynx without erythema or exudate. Lungs: no accessory muscle use, CTAB, no wheezes or rales Cardiovascular: RRR, no m/r/g, no peripheral edema Neuro:  A&Ox3, CN II-XII intact, normal gait Skin:  Warm, no lesions/ rash   Wt Readings from Last 3 Encounters:  11/10/18 256 lb (116.1 kg)  10/11/18 253 lb (114.8 kg)  06/20/15 244 lb 6.1 oz (110.9 kg)    Lab Results  Component Value Date   WBC 11.7 (H) 02/22/2009   HGB 11.0 DELTA CHECK NOTED (L) 02/22/2009   HCT 32.2 (L) 02/22/2009   PLT 218 DELTA CHECK NOTED 02/22/2009   GLUCOSE 119 (H) 02/20/2009   ALT 17 02/20/2009   AST 18  02/20/2009   NA 135 02/20/2009   K 3.8 02/20/2009   CL 99 02/20/2009   CREATININE 0.94 02/20/2009   BUN 15 02/20/2009   CO2 26 02/20/2009    Assessment/Plan:  Essential hypertension  -controlled -continue current meds -will send 90 d supply of losartan to pharmacy - Plan: losartan (COZAAR) 100 MG tablet -continue checking bp at home -continue lifestyle modifications  F/u in 3 months  [name], MD

## 2018-11-11 ENCOUNTER — Encounter: Payer: Self-pay | Admitting: Family Medicine

## 2019-03-27 ENCOUNTER — Telehealth: Payer: Self-pay | Admitting: Family Medicine

## 2019-03-27 NOTE — Telephone Encounter (Signed)
Copied from [place] [id number]. Topic: Quick Communication - Rx Refill/Question >> Mar 27, 2019  8:32 AM Scherrie Gerlach wrote: Medication:  HYDROCHLOROTHIAZIDE  Pt was switched to this med and Dr Volanda Napoleon has never written this for the pt. [place], [place] - [address] [phone] (Phone) [phone] (Fax)

## 2019-03-28 ENCOUNTER — Other Ambulatory Visit: Payer: Self-pay

## 2019-03-28 MED ORDER — HYDROCHLOROTHIAZIDE 12.5 MG PO TABS: 12.5000 mg | ORAL_TABLET | Freq: Every day | ORAL | 1 refills | Status: DC

## 2019-03-28 NOTE — Telephone Encounter (Signed)
Rx sent to pt pharmacy 

## 2019-08-30 ENCOUNTER — Other Ambulatory Visit: Payer: Self-pay | Admitting: Family Medicine

## 2019-09-28 ENCOUNTER — Ambulatory Visit (INDEPENDENT_AMBULATORY_CARE_PROVIDER_SITE_OTHER): Payer: BC Managed Care – PPO | Admitting: Family Medicine

## 2019-09-28 ENCOUNTER — Encounter: Payer: Self-pay | Admitting: Family Medicine

## 2019-09-28 ENCOUNTER — Other Ambulatory Visit: Payer: Self-pay

## 2019-09-28 VITALS — BP 118/78 | HR 70 | Temp 98.0°F | Wt 254.0 lb

## 2019-09-28 DIAGNOSIS — R079 Chest pain, unspecified: Secondary | ICD-10-CM | POA: Diagnosis not present

## 2019-09-28 DIAGNOSIS — Z6841 Body Mass Index (BMI) 40.0 and over, adult: Secondary | ICD-10-CM | POA: Diagnosis not present

## 2019-09-28 DIAGNOSIS — I10 Essential (primary) hypertension: Secondary | ICD-10-CM | POA: Insufficient documentation

## 2019-09-28 DIAGNOSIS — Z0000 Encounter for general adult medical examination without abnormal findings: Secondary | ICD-10-CM

## 2019-09-28 DIAGNOSIS — Z23 Encounter for immunization: Secondary | ICD-10-CM | POA: Diagnosis not present

## 2019-09-28 DIAGNOSIS — Z0001 Encounter for general adult medical examination with abnormal findings: Secondary | ICD-10-CM | POA: Diagnosis not present

## 2019-09-28 DIAGNOSIS — Z1211 Encounter for screening for malignant neoplasm of colon: Secondary | ICD-10-CM

## 2019-09-28 DIAGNOSIS — N761 Subacute and chronic vaginitis: Secondary | ICD-10-CM

## 2019-09-28 LAB — TSH: TSH: 1.91 u[IU]/mL (ref 0.35–4.50)

## 2019-09-28 LAB — BASIC METABOLIC PANEL
BUN: 12 mg/dL (ref 6–23)
CO2: 30 mEq/L (ref 19–32)
Calcium: 9.8 mg/dL (ref 8.4–10.5)
Chloride: 101 mEq/L (ref 96–112)
Creatinine, Ser: 0.8 mg/dL (ref 0.40–1.20)
GFR: 72.6 mL/min (ref >60.00)
Glucose, Bld: 126 mg/dL — ABNORMAL HIGH (ref 70–99)
Potassium: 4 mEq/L (ref 3.5–5.1)
Sodium: 140 mEq/L (ref 135–145)

## 2019-09-28 LAB — CBC WITH DIFFERENTIAL/PLATELET
Basophils Absolute: 0 10*3/uL (ref 0.0–0.1)
Basophils Relative: 0.6 % (ref 0.0–3.0)
Eosinophils Absolute: 0.2 10*3/uL (ref 0.0–0.7)
Eosinophils Relative: 2.2 % (ref 0.0–5.0)
HCT: 41.8 % (ref 36.0–46.0)
Hemoglobin: 14.5 g/dL (ref 12.0–15.0)
Lymphocytes Relative: 30.4 % (ref 12.0–46.0)
Lymphs Abs: 2.3 10*3/uL (ref 0.7–4.0)
MCHC: 34.8 g/dL (ref 30.0–36.0)
MCV: 95.4 fl (ref 78.0–100.0)
Monocytes Absolute: 0.4 10*3/uL (ref 0.1–1.0)
Monocytes Relative: 4.8 % (ref 3.0–12.0)
Neutro Abs: 4.8 10*3/uL (ref 1.4–7.7)
Neutrophils Relative %: 62 % (ref 43.0–77.0)
Platelets: 277 10*3/uL (ref 150.0–400.0)
RBC: 4.39 Mil/uL (ref 3.87–5.11)
RDW: 12.4 % (ref 11.5–15.5)
WBC: 7.7 10*3/uL (ref 4.0–10.5)

## 2019-09-28 LAB — LIPID PANEL
Cholesterol: 199 mg/dL (ref 0–200)
HDL: 41.7 mg/dL (ref >39.00)
LDL Cholesterol: 130 mg/dL — ABNORMAL HIGH (ref 0–99)
NonHDL: 157.68
Total CHOL/HDL Ratio: 5
Triglycerides: 139 mg/dL (ref 0.0–149.0)
VLDL: 27.8 mg/dL (ref 0.0–40.0)

## 2019-09-28 LAB — T4, FREE: Free T4: 0.9 ng/dL (ref 0.60–1.60)

## 2019-09-28 LAB — HEMOGLOBIN A1C: Hgb A1c MFr Bld: 6.3 % (ref 4.6–6.5)

## 2019-09-28 NOTE — Progress Notes (Signed)
Subjective:     Savannah Simon is a 63 y.o. female and is here for a comprehensive physical exam. The patient reports problems - intermittent chest pain, possible yeast infection.  Pt with 3 episodes of chest tightness and pain since March that radiates to back and jaw.  Causes b/l UE numbness and tingling.  Worse with stress.  Pt has thought about calling EMS, but symptoms resolve by the time she goes to do it.   Pt endorses increased stress (her husband's health and work).  Pt also endorses weight gain from stress eating.  Denies n/v, changes in vision, SOB, dizziness.  Pt also with vaginal irritation and clear d/c x 1 mo.  States "feels damp".  Pt tried OTC yeast medication which helped briefly.    Needs pap, mammogram ,and colonoscopy.  Social History   Socioeconomic History  . Marital status: Married    Spouse name: Not on file  . Number of children: Not on file  . Years of education: Not on file  . Highest education level: Not on file  Occupational History  . Not on file  Social Needs  . Financial resource strain: Not on file  . Food insecurity    Worry: Not on file    Inability: Not on file  . Transportation needs    Medical: Not on file    Non-medical: Not on file  Tobacco Use  . Smoking status: Never Smoker  . Smokeless tobacco: Never Used  Substance and Sexual Activity  . Alcohol use: Yes  . Drug use: Never  . Sexual activity: Not Currently  Lifestyle  . Physical activity    Days per week: Not on file    Minutes per session: Not on file  . Stress: Not on file  Relationships  . Social Herbalist on phone: Not on file    Gets together: Not on file    Attends religious service: Not on file    Active member of club or organization: Not on file    Attends meetings of clubs or organizations: Not on file    Relationship status: Not on file  . Intimate partner violence    Fear of current or ex partner: Not on file    Emotionally abused: Not on file   Physically abused: Not on file    Forced sexual activity: Not on file  Other Topics Concern  . Not on file  Social History Narrative  . Not on file   Health Maintenance  Topic Date Due  . Hepatitis C Screening  [birth year]  . HIV Screening  05/21/1972  . PAP SMEAR-Modifier  05/21/1978  . COLONOSCOPY  05/22/2007  . INFLUENZA VACCINE  06/24/2019  . MAMMOGRAM  10/27/2019  . TETANUS/TDAP  07/23/2026    The following portions of the patient's history were reviewed and updated as appropriate: allergies, current medications, past family history, past medical history, past social history, past surgical history and problem list.  Review of Systems Pertinent items noted in HPI and remainder of comprehensive ROS otherwise negative.   Objective:    BP 118/78 (BP Location: Left Arm, Patient Position: Sitting, Cuff Size: Large)   Pulse 70   Temp 98 F (36.7 C) (Oral)   Wt 254 lb (115.2 kg)   SpO2 98%   BMI 42.27 kg/m  General appearance: alert, cooperative and no distress Head: Normocephalic, without obvious abnormality, atraumatic Eyes: conjunctivae/corneas clear. PERRL, EOM's intact. Fundi benign. Ears: normal TM's and external ear  canals both ears Nose: Nares normal. Septum midline. Mucosa normal. No drainage or sinus tenderness. Throat: lips, mucosa, and tongue normal; teeth and gums normal Neck: no adenopathy, no carotid bruit, no JVD, supple, symmetrical, trachea midline and thyroid not enlarged, symmetric, no tenderness/mass/nodules Lungs: clear to auscultation bilaterally Heart: regular rate and rhythm, S1, S2 normal, no murmur, click, rub or gallop Abdomen: soft, non-tender; bowel sounds normal; no masses,  no organomegaly Extremities: extremities normal, atraumatic, no cyanosis or edema Pulses: 2+ and symmetric Skin: Skin color, texture, turgor normal. No rashes or lesions Lymph nodes: Cervical, supraclavicular, and axillary nodes normal. Neurologic: Alert and oriented X 3,  normal strength and tone. Normal symmetric reflexes. Normal coordination and gait    Assessment:    63 yo female exam with episodes of chest pain and vaginitis.      Plan:     Anticipatory guidance given including wearing seatbelts, smoke detectors in the home, increasing physical activity, increasing p.o. intake of water and vegetables. -will obtain labs -mammogram due.  Given info to schedule -pap due, will schedule in the next few wks. -colonoscopy due.  Referral placed. -influenza vaccine given this visit. -given handout -next CPE in 1 yr. See After Visit Summary for Counseling Recommendations    Chest pain, unspecified type  -likely 2/2 increased stress.   -normal EKG.  NSR, no ST elevation or T wave inversion noted. -referral to Cards for stress test. - Plan: TSH, T4, Free, Lipid Panel, EKG 12-Lead, Ambulatory referral to Cardiology  Essential hypertension  -controlled -continue losartan 100 mg, HCTZ 12.5 mg daily - Plan: Basic Metabolic Panel  Class 3 severe obesity due to excess calories without serious comorbidity with body mass index (BMI) of 40.0 to 44.9 in adult [facility])  -discussed increasing physical activity and limiting stress eating. - Plan: Hemoglobin A1c, Lipid Panel  Subacute vaginitis  Colon cancer screening  - Plan: Ambulatory referral to Gastroenterology  [name], MD

## 2019-09-28 NOTE — Patient Instructions (Signed)
Preventive Care 40-62 Years Old, Female Preventive care refers to visits with your health care provider and lifestyle choices that can promote health and wellness. This includes:  A yearly physical exam. This may also be called an annual well check.  Regular dental visits and eye exams.  Immunizations.  Screening for certain conditions.  Healthy lifestyle choices, such as eating a healthy diet, getting regular exercise, not using drugs or products that contain nicotine and tobacco, and limiting alcohol use. What can I expect for my preventive care visit? Physical exam Your health care provider will check your:  Height and weight. This may be used to calculate body mass index (BMI), which tells if you are at a healthy weight.  Heart rate and blood pressure.  Skin for abnormal spots. Counseling Your health care provider may ask you questions about your:  Alcohol, tobacco, and drug use.  Emotional well-being.  Home and relationship well-being.  Sexual activity.  Eating habits.  Work and work environment.  Method of birth control.  Menstrual cycle.  Pregnancy history. What immunizations do I need?  Influenza (flu) vaccine  This is recommended every year. Tetanus, diphtheria, and pertussis (Tdap) vaccine  You may need a Td booster every 10 years. Varicella (chickenpox) vaccine  You may need this if you have not been vaccinated. Zoster (shingles) vaccine  You may need this after age 60. Measles, mumps, and rubella (MMR) vaccine  You may need at least one dose of MMR if you were born in 1957 or later. You may also need a second dose. Pneumococcal conjugate (PCV13) vaccine  You may need this if you have certain conditions and were not previously vaccinated. Pneumococcal polysaccharide (PPSV23) vaccine  You may need one or two doses if you smoke cigarettes or if you have certain conditions. Meningococcal conjugate (MenACWY) vaccine  You may need this if you  have certain conditions. Hepatitis A vaccine  You may need this if you have certain conditions or if you travel or work in places where you may be exposed to hepatitis A. Hepatitis B vaccine  You may need this if you have certain conditions or if you travel or work in places where you may be exposed to hepatitis B. Haemophilus influenzae type b (Hib) vaccine  You may need this if you have certain conditions. Human papillomavirus (HPV) vaccine  If recommended by your health care provider, you may need three doses over 6 months. You may receive vaccines as individual doses or as more than one vaccine together in one shot (combination vaccines). Talk with your health care provider about the risks and benefits of combination vaccines. What tests do I need? Blood tests  Lipid and cholesterol levels. These may be checked every 5 years, or more frequently if you are over 50 years old.  Hepatitis C test.  Hepatitis B test. Screening  Lung cancer screening. You may have this screening every year starting at age 55 if you have a 30-pack-year history of smoking and currently smoke or have quit within the past 15 years.  Colorectal cancer screening. All adults should have this screening starting at age 50 and continuing until age 75. Your health care provider may recommend screening at age 45 if you are at increased risk. You will have tests every 1-10 years, depending on your results and the type of screening test.  Diabetes screening. This is done by checking your blood sugar (glucose) after you have not eaten for a while (fasting). You may have this   done every 1-3 years.  Mammogram. This may be done every 1-2 years. Talk with your health care provider about when you should start having regular mammograms. This may depend on whether you have a family history of breast cancer.  BRCA-related cancer screening. This may be done if you have a family history of breast, ovarian, tubal, or peritoneal  cancers.  Pelvic exam and Pap test. This may be done every 3 years starting at age 3. Starting at age 57, this may be done every 5 years if you have a Pap test in combination with an HPV test. Other tests  Sexually transmitted disease (STD) testing.  Bone density scan. This is done to screen for osteoporosis. You may have this scan if you are at high risk for osteoporosis. Follow these instructions at home: Eating and drinking  Eat a diet that includes fresh fruits and vegetables, whole grains, lean protein, and low-fat dairy.  Take vitamin and mineral supplements as recommended by your health care provider.  Do not drink alcohol if: ? Your health care provider tells you not to drink. ? You are pregnant, may be pregnant, or are planning to become pregnant.  If you drink alcohol: ? Limit how much you have to 0-1 drink a day. ? Be aware of how much alcohol is in your drink. In the [place]., one drink equals one 12 oz bottle of beer (355 mL), one 5 oz glass of wine (148 mL), or one 1 oz glass of hard liquor (44 mL). Lifestyle  Take daily care of your teeth and gums.  Stay active. Exercise for at least 30 minutes on 5 or more days each week.  Do not use any products that contain nicotine or tobacco, such as cigarettes, e-cigarettes, and chewing tobacco. If you need help quitting, ask your health care provider.  If you are sexually active, practice safe sex. Use a condom or other form of birth control (contraception) in order to prevent pregnancy and STIs (sexually transmitted infections).  If told by your health care provider, take low-dose aspirin daily starting at age 23. What's next?  Visit your health care provider once a year for a well check visit.  Ask your health care provider how often you should have your eyes and teeth checked.  Stay up to date on all vaccines. This information is not intended to replace advice given to you by your health care provider. Make sure you  discuss any questions you have with your health care provider. Document Released: 12/06/2015 Document Revised: 07/21/2018 Document Reviewed: 07/21/2018 Elsevier Patient Education  2020 [place].  Nonspecific Chest Pain, Adult Chest pain can be caused by many different conditions. It can be caused by a condition that is life-threatening and requires treatment right away. It can also be caused by something that is not life-threatening. If you have chest pain, it can be hard to know the difference, so it is important to get help right away to make sure that you do not have a serious condition. Some life-threatening causes of chest pain include:  Heart attack.  A tear in the body's main blood vessel (aortic dissection).  Inflammation around your heart (pericarditis).  A problem in the lungs, such as a blood clot (pulmonary embolism) or a collapsed lung (pneumothorax). Some non life-threatening causes of chest pain include:  Heartburn.  Anxiety or stress.  Damage to the bones, muscles, and cartilage that make up your chest wall.  Pneumonia or bronchitis.  Shingles infection (varicella-zoster virus).  Chest pain can feel like:  Pain or discomfort on the surface of your chest or deep in your chest.  Crushing, pressure, aching, or squeezing pain.  Burning or tingling.  Dull or sharp pain that is worse when you move, cough, or take a deep breath.  Pain or discomfort that is also felt in your back, neck, jaw, shoulder, or arm, or pain that spreads to any of these areas. Your chest pain may come and go. It may also be constant. Your health care provider will do lab tests and other studies to find the cause of your pain. Treatment will depend on the cause of your chest pain. Follow these instructions at home: Medicines  Take over-the-counter and prescription medicines only as told by your health care provider.  If you were prescribed an antibiotic, take it as told by your health  care provider. Do not stop taking the antibiotic even if you start to feel better. Lifestyle   Rest as directed by your health care provider.  Do not use any products that contain nicotine or tobacco, such as cigarettes and e-cigarettes. If you need help quitting, ask your health care provider.  Do not drink alcohol.  Make healthy lifestyle choices as recommended. These may include: ? Getting regular exercise. Ask your health care provider to suggest some activities that are safe for you. ? Eating a heart-healthy diet. This includes plenty of fresh fruits and vegetables, whole grains, low-fat (lean) protein, and low-fat dairy products. A dietitian can help you find healthy eating options. ? Maintaining a healthy weight. ? Managing any other health conditions you have, such as high blood pressure (hypertension) or diabetes. ? Reducing stress, such as with yoga or relaxation techniques. General instructions  Pay attention to any changes in your symptoms. Tell your health care provider about them or any new symptoms.  Avoid any activities that cause chest pain.  Keep all follow-up visits as told by your health care provider. This is important. This includes visits for any further testing if your chest pain does not go away. Contact a health care provider if:  Your chest pain does not go away.  You feel depressed.  You have a fever. Get help right away if:  Your chest pain gets worse.  You have a cough that gets worse, or you cough up blood.  You have severe pain in your abdomen.  You faint.  You have sudden, unexplained chest discomfort.  You have sudden, unexplained discomfort in your arms, back, neck, or jaw.  You have shortness of breath at any time.  You suddenly start to sweat, or your skin gets clammy.  You feel nausea or you vomit.  You suddenly feel lightheaded or dizzy.  You have severe weakness, or unexplained weakness or fatigue.  Your heart begins to  beat quickly, or it feels like it is skipping beats. These symptoms may represent a serious problem that is an emergency. Do not wait to see if the symptoms will go away. Get medical help right away. Call your local emergency services (911 in the [place] Do not drive yourself to the hospital. Summary  Chest pain can be caused by a condition that is serious and requires urgent treatment. It may also be caused by something that is not life-threatening.  If you have chest pain, it is very important to see your health care provider. Your health care provider may do lab tests and other studies to find the cause of your pain.  Follow  your health care provider's instructions on taking medicines, making lifestyle changes, and getting emergency treatment if symptoms become worse.  Keep all follow-up visits as told by your health care provider. This includes visits for any further testing if your chest pain does not go away. This information is not intended to replace advice given to you by your health care provider. Make sure you discuss any questions you have with your health care provider. Document Released: 08/19/2005 Document Revised: 05/12/2018 Document Reviewed: 05/12/2018 Elsevier Patient Education  2020 [employer].

## 2019-09-29 ENCOUNTER — Encounter: Payer: Self-pay | Admitting: Gastroenterology

## 2019-09-30 ENCOUNTER — Encounter: Payer: Self-pay | Admitting: Family Medicine

## 2019-09-30 DIAGNOSIS — R7303 Prediabetes: Secondary | ICD-10-CM | POA: Insufficient documentation

## 2019-09-30 MED ORDER — FLUCONAZOLE 150 MG PO TABS: 150.0000 mg | ORAL_TABLET | Freq: Once | ORAL | 0 refills | Status: AC

## 2019-10-04 ENCOUNTER — Other Ambulatory Visit: Payer: Self-pay | Admitting: Family Medicine

## 2019-10-04 NOTE — Telephone Encounter (Signed)
Patient states at 09/28/2019 PCP was suppose to send in a  90 day supply of hydrochlorothiazide (HYDRODIURIL) 12.5 MG tablet and propranolol (INDERAL) 20 MG tablet , patient would like this request expedited, please advise    Selah [place], [place] [address]

## 2019-10-05 ENCOUNTER — Encounter: Payer: Self-pay | Admitting: Family Medicine

## 2019-10-11 ENCOUNTER — Telehealth: Payer: Self-pay | Admitting: *Deleted

## 2019-10-11 NOTE — Telephone Encounter (Signed)
Pt has a PV 11-24 for a colon 12-14- she has been having chest pain and has an OV with Dr Gwenlyn Found 12-4- new cardiology-  I called pt and spoke with her about her chest pain- she thinks it is stress related to her job but has a family hx of cardiac issues and heart disease-  I explained to her that with pending cardiac testing, she may have to cancel her colon. We decided for her to come to [facility] 11-24, see the nurse, get instructions for colon, see Dr Gwenlyn Found 12-4 and IF he orders ANY testing like echo or stress test , she will cal and cancel her colon until all testing complete.   [name] PV

## 2019-10-11 NOTE — Telephone Encounter (Signed)
Pt is calling back and wanted to know why all of her medication are not being filled and she still want to have her PAP SMEAR and Screenings (HEP-C).  Pt state that her Panpranolol did not get send in and wanted to know why?   Pharmacy: [facility] on [address].  Pt wanted to know when you wanted her to do a follow-up appointment she only want to come in once for all three request (PAP SMEAR, HEP-C AND FOLLOW--UP) please advise.

## 2019-10-12 ENCOUNTER — Other Ambulatory Visit: Payer: Self-pay | Admitting: Family Medicine

## 2019-10-12 MED ORDER — HYDROCHLOROTHIAZIDE 12.5 MG PO TABS: 12.5000 mg | ORAL_TABLET | Freq: Every day | ORAL | 3 refills | Status: DC

## 2019-10-12 MED ORDER — PROPRANOLOL HCL 20 MG PO TABS: 20.0000 mg | ORAL_TABLET | Freq: Every day | ORAL | 3 refills | Status: DC

## 2019-10-17 ENCOUNTER — Other Ambulatory Visit: Payer: Self-pay

## 2019-10-17 ENCOUNTER — Ambulatory Visit (AMBULATORY_SURGERY_CENTER): Payer: Self-pay

## 2019-10-17 VITALS — Temp 96.8°F | Ht 65.0 in | Wt 260.2 lb

## 2019-10-17 DIAGNOSIS — Z1211 Encounter for screening for malignant neoplasm of colon: Secondary | ICD-10-CM

## 2019-10-17 MED ORDER — NA SULFATE-K SULFATE-MG SULF 17.5-3.13-1.6 GM/177ML PO SOLN: 1.0000 | Freq: Once | ORAL | 0 refills | Status: AC

## 2019-10-17 NOTE — Progress Notes (Signed)
Denies allergies to eggs or soy products. Denies complication of anesthesia or sedation. Denies use of weight loss medication. Denies use of O2.   Emmi instructions given for colonoscopy.  Covid screening is scheduled for Thursday 11/02/19 @ 3:20 Pm. A 15.00 coupon for Suprep was given to the patient.

## 2019-10-23 ENCOUNTER — Encounter: Payer: Self-pay | Admitting: Gastroenterology

## 2019-10-25 ENCOUNTER — Ambulatory Visit: Payer: BC Managed Care – PPO | Admitting: Cardiovascular Disease

## 2019-10-27 ENCOUNTER — Telehealth: Payer: Self-pay | Admitting: Gastroenterology

## 2019-10-27 ENCOUNTER — Other Ambulatory Visit: Payer: Self-pay

## 2019-10-27 ENCOUNTER — Encounter: Payer: Self-pay | Admitting: Cardiovascular Disease

## 2019-10-27 ENCOUNTER — Ambulatory Visit: Payer: BC Managed Care – PPO | Admitting: Cardiovascular Disease

## 2019-10-27 DIAGNOSIS — R072 Precordial pain: Secondary | ICD-10-CM | POA: Diagnosis not present

## 2019-10-27 DIAGNOSIS — R0789 Other chest pain: Secondary | ICD-10-CM | POA: Insufficient documentation

## 2019-10-27 DIAGNOSIS — E785 Hyperlipidemia, unspecified: Secondary | ICD-10-CM | POA: Insufficient documentation

## 2019-10-27 NOTE — Progress Notes (Signed)
10/27/2019 [facility]   [birth year]  [id number]  Primary Physician Billie Ruddy, MD Primary Cardiologist: Lorretta Harp MD Garret Reddish, [place], [place]  HPI:  Savannah Simon is a 63 y.o. significantly overweight married Caucasian female with no children referred by Dr. Volanda Napoleon for cardiovascular valuation because of atypical chest pain.  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  Her cardiac risk factors are notable for treated hypertension but are otherwise negative.  She is never had a heart attack or stroke.  There is no family history for heart disease.  She is fairly active and says she walks at least 5 miles a day without symptoms or limitation.  She began noticing atypical chest pain approximately months ago (coincident with the onset of COVID-19) and has had approximately 5 episodes lasting approximately 5 minutes each.  There is atypical in nature and not brought on by any particular activity.  They radiate to her neck shoulder and back as well.   Current Meds  Medication Sig  . ASPIRIN 81 PO Take 81 mg by mouth daily.  . hydrochlorothiazide (HYDRODIURIL) 12.5 MG tablet Take 1 tablet (12.5 mg total) by mouth daily.  Marland Kitchen losartan (COZAAR) 100 MG tablet Take 1 tablet (100 mg total) by mouth daily.  . metoprolol tartrate (LOPRESSOR) 50 MG tablet Take 50 mg by mouth daily.  . Multiple Vitamins-Minerals (CENTRUM ADULTS PO) Take by mouth.  . Omega-3 Fatty Acids (FISH OIL) 1200 MG CAPS Take 1,200 mg by mouth daily.  Marland Kitchen OVER THE COUNTER MEDICATION Vitamin D 3, one capsule daily.  . propranolol (INDERAL) 20 MG tablet Take 1 tablet (20 mg total) by mouth daily.     Allergies  Allergen Reactions  . Other Dermatitis    Contact with stainless steal    Social History   Socioeconomic History  . Marital status: Married    Spouse name: Not on file  . Number of children: Not on file  . Years of education: Not on file  . Highest education  level: Not on file  Occupational History  . Not on file  Social Needs  . Financial resource strain: Not on file  . Food insecurity    Worry: Not on file    Inability: Not on file  . Transportation needs    Medical: Not on file    Non-medical: Not on file  Tobacco Use  . Smoking status: Never Smoker  . Smokeless tobacco: Never Used  Substance and Sexual Activity  . Alcohol use: Yes  . Drug use: Never  . Sexual activity: Not Currently  Lifestyle  . Physical activity    Days per week: Not on file    Minutes per session: Not on file  . Stress: Not on file  Relationships  . Social Herbalist on phone: Not on file    Gets together: Not on file    Attends religious service: Not on file    Active member of club or organization: Not on file    Attends meetings of clubs or organizations: Not on file    Relationship status: Not on file  . Intimate partner violence    Fear of current or ex partner: Not on file    Emotionally abused: Not on file    Physically abused: Not on file    Forced sexual activity: Not on file  Other Topics Concern  . Not on file  Social History Narrative  .  Not on file     Review of Systems: General: negative for chills, fever, night sweats or weight changes.  Cardiovascular: negative for chest pain, dyspnea on exertion, edema, orthopnea, palpitations, paroxysmal nocturnal dyspnea or shortness of breath Dermatological: negative for rash Respiratory: negative for cough or wheezing Urologic: negative for hematuria Abdominal: negative for nausea, vomiting, diarrhea, bright red blood per rectum, melena, or hematemesis Neurologic: negative for visual changes, syncope, or dizziness All other systems reviewed and are otherwise negative except as noted above.    Blood pressure (!) 184/88, pulse 62, temperature 98.1 F (36.7 C), height 5\' 5"  (1.651 m), weight 259 lb 9.6 oz (117.8 kg), SpO2 99 %.  General appearance: alert and no distress Neck: no  adenopathy, no carotid bruit, no JVD, supple, symmetrical, trachea midline and thyroid not enlarged, symmetric, no tenderness/mass/nodules Lungs: clear to auscultation bilaterally Heart: regular rate and rhythm, S1, S2 normal, no murmur, click, rub or gallop Extremities: extremities normal, atraumatic, no cyanosis or edema Pulses: 2+ and symmetric Skin: Skin color, texture, turgor normal. No rashes or lesions Neurologic: Alert and oriented X 3, normal strength and tone. Normal symmetric reflexes. Normal coordination and gait  EKG sinus bradycardia 57 without ST or T wave changes.  I personally reviewed this EKG.  ASSESSMENT AND PLAN:   Hyperlipidemia History of hyperlipidemia not on statin therapy with lipid profile performed 09/28/2019 revealing total cholesterol 199, LDL 130 and HDL 41.  Essential hypertension History of essential hypertension with blood pressure measured today at 184/88.  She is on hydrochlorothiazide, metoprolol ,losartan.  Atypical chest pain New onset atypical chest pain beginning probably 2 coincident with the onset of COVID-19.  She has had approximately 5 episodes of the last 8 months.  They usually occur spontaneously, last approximate 5 minutes and and then resolve.  She has minimal cardiac risk factors.  She does think that these are stress related.  I am going to get a coronary calcium score to risk stratify.      Lorretta Harp MD FACP,FACC,FAHA, [facility] 10/27/2019 2:22 PM

## 2019-10-27 NOTE — Assessment & Plan Note (Signed)
New onset atypical chest pain beginning probably 2 coincident with the onset of COVID-19.  She has had approximately 5 episodes of the last 8 months.  They usually occur spontaneously, last approximate 5 minutes and and then resolve.  She has minimal cardiac risk factors.  She does think that these are stress related.  I am going to get a coronary calcium score to risk stratify.

## 2019-10-27 NOTE — Telephone Encounter (Signed)
Patient is scheduled for her colonoscopy on 12/14. The patient had an OV with Dr. Quay Burow (cardiology) for atypical chest pain on 12/4. Dr. Gwenlyn Found determined she needed to have a Coronary Calcium Score which is scheduled for 12/11. The patient wants to know if she should proceed with the colonoscopy. Please advise.

## 2019-10-27 NOTE — Patient Instructions (Signed)
Medication Instructions:  Your physician recommends that you continue on your current medications as directed. Please refer to the Current Medication list given to you today.  If you need a refill on your cardiac medications before your next appointment, please call your pharmacy.   Lab work: NONE  Testing/Procedures: Coronary Calcium Score  Follow-Up: At [facility], you and your health needs are our priority.  As part of our continuing mission to provide you with exceptional heart care, we have created designated Provider Care Teams.  These Care Teams include your primary Cardiologist (physician) and Advanced Practice Providers (APPs -  Physician Assistants and Nurse Practitioners) who all work together to provide you with the care you need, when you need it. You may see Dr Gwenlyn Found or one of the following Advanced Practice Providers on your designated Care Team:    Kerin Ransom, PA-C  [place], [place]  Coletta Memos, [place] Your physician wants you to follow-up as needed.

## 2019-10-27 NOTE — Assessment & Plan Note (Signed)
History of hyperlipidemia not on statin therapy with lipid profile performed 09/28/2019 revealing total cholesterol 199, LDL 130 and HDL 41.

## 2019-10-27 NOTE — Assessment & Plan Note (Signed)
History of essential hypertension with blood pressure measured today at 184/88.  She is on hydrochlorothiazide, metoprolol ,losartan.

## 2019-10-30 NOTE — Telephone Encounter (Signed)
I reviewed Dr. Kennon Holter note, he not feel her chest pain is likely to be cardiac in nature.  EKG reviewed by him that visit normal.  We can proceed with colonoscopy as scheduled if she feels comfortable doing so.  It is a routine procedure, so if she has any reservations, it should be rescheduled until after cardiac CT and follow up with cardiology.

## 2019-10-30 NOTE — Telephone Encounter (Signed)
FYI-  Spoke to the patient who reports her husband has been admitted to [facility] with significant medical problems. The patient cancelled her procedure/COVID screening to focus on her husbands care.

## 2019-11-03 ENCOUNTER — Other Ambulatory Visit: Payer: Self-pay

## 2019-11-03 ENCOUNTER — Ambulatory Visit (INDEPENDENT_AMBULATORY_CARE_PROVIDER_SITE_OTHER)
Admission: RE | Admit: 2019-11-03 | Discharge: 2019-11-03 | Disposition: A | Discharge status: Home / Self Care | Payer: Self-pay | Source: Ambulatory Visit | Attending: Cardiovascular Disease | Admitting: Cardiovascular Disease

## 2019-11-03 DIAGNOSIS — R072 Precordial pain: Secondary | ICD-10-CM

## 2019-11-06 ENCOUNTER — Encounter: Payer: BC Managed Care – PPO | Admitting: Gastroenterology

## 2019-11-08 ENCOUNTER — Other Ambulatory Visit: Payer: BC Managed Care – PPO

## 2019-12-23 ENCOUNTER — Encounter: Payer: Self-pay | Admitting: Family Medicine

## 2019-12-25 ENCOUNTER — Other Ambulatory Visit: Payer: Self-pay

## 2019-12-25 MED ORDER — METOPROLOL TARTRATE 50 MG PO TABS: 50.0000 mg | ORAL_TABLET | Freq: Every day | ORAL | 1 refills | Status: DC

## 2019-12-26 ENCOUNTER — Other Ambulatory Visit: Payer: Self-pay

## 2019-12-26 ENCOUNTER — Telehealth: Payer: Self-pay | Admitting: Family Medicine

## 2019-12-26 MED ORDER — METOPROLOL SUCCINATE ER 50 MG PO TB24: 50.0000 mg | ORAL_TABLET | Freq: Every day | ORAL | 3 refills | Status: DC

## 2019-12-26 NOTE — Telephone Encounter (Signed)
[facility] called in reference to a medication that used to be fast release and was wanting to know if this was supposed to have a change.  metoprolol tartrate (LOPRESSOR) 50 MG tablet   [place] [address], [place] [id number] Phone:  [phone]  Fax:  [phone]

## 2019-12-26 NOTE — Telephone Encounter (Signed)
Called pt left a message for pt to call the office regarding her Rx for Metoprolol

## 2019-12-26 NOTE — Telephone Encounter (Signed)
Spoke with pt aware that Rx for Metoprolol was sent to her pharmacy

## 2019-12-26 NOTE — Telephone Encounter (Signed)
Pt returned phone call in reference to medication. Izora Gala was not available to take call, she is requesting a call back.

## 2020-01-02 ENCOUNTER — Other Ambulatory Visit: Payer: Self-pay

## 2020-01-02 ENCOUNTER — Encounter (HOSPITAL_COMMUNITY): Payer: Self-pay | Admitting: Emergency Medicine

## 2020-01-02 ENCOUNTER — Ambulatory Visit (HOSPITAL_COMMUNITY)
Admission: EM | Admit: 2020-01-02 | Discharge: 2020-01-02 | Disposition: A | Discharge status: Home / Self Care | Payer: BC Managed Care – PPO | Attending: Family Medicine | Admitting: Family Medicine

## 2020-01-02 DIAGNOSIS — L03011 Cellulitis of right finger: Secondary | ICD-10-CM | POA: Diagnosis not present

## 2020-01-02 DIAGNOSIS — I10 Essential (primary) hypertension: Secondary | ICD-10-CM

## 2020-01-02 MED ORDER — DOXYCYCLINE HYCLATE 100 MG PO CAPS: 100.0000 mg | ORAL_CAPSULE | Freq: Two times a day (BID) | ORAL | 0 refills | Status: DC

## 2020-01-02 NOTE — ED Triage Notes (Signed)
Pt here with redness and swelling to right thumb; purulent discharge noted under nail at cuticle

## 2020-01-03 NOTE — ED Provider Notes (Signed)
[facility]   [id number] 01/02/20 Arrival Time: 1947  ASSESSMENT & PLAN:  1. Acute paronychia of right thumb     Incision and Drainage Procedure Note  Anesthesia: pain ease spray  Procedure Details  The procedure, risks and complications have been discussed in detail (including, but not limited to pain and bleeding) with the patient.  The skin induration was prepped and draped in the usual fashion. After adequate local anesthesia, I&D with a #11 blade was performed on the nailfold of R thumb with moderate, purulent drainage.  EBL: minimal Drains: none Packing: none Condition: Tolerated procedure well Complications: none.  Begin: Meds ordered this encounter  Medications  . doxycycline (VIBRAMYCIN) 100 MG capsule    Sig: Take 1 capsule (100 mg total) by mouth 2 (two) times daily.    Dispense:  20 capsule    Refill:  0    To return in 48 hours for wound check if needed. Bandaged. Finish all antibiotics. OTC analgesics as needed.  May recheck BP here at her convenience. No s/s of HTN urgency. Reviewed expectations re: course of current medical issues. Questions answered. Outlined signs and symptoms indicating need for more acute intervention. Patient verbalized understanding. After Visit Summary given.   SUBJECTIVE:  Savannah Simon is a 63 y.o. female who presents with a possible infection of her R thumb. Onset gradual, first noted yesterday; without active drainage and without active bleeding. Is painful. No injury. Symptoms have progressed to a point and plateaued since beginning. Fever: absent. OTC/home treatment: none reported.  Increased blood pressure noted today. Reports that she is treated for HTN. She reports taking medications as instructed, no chest pain on exertion and no palpitations.   OBJECTIVE:  Vitals:   01/02/20 2012  BP: (!) 188/90  Pulse: 80  Resp: 18  Temp: 98.4 F (36.9 C)  TempSrc: Oral  SpO2: 99%     General  appearance: alert; no distress Pulse: regular Skin: approx 0.25 cm induration of the nailfold of her R thumb; is fluctuant and tender to touch; no active drainage or bleeding; R thumb with FROM and normal distal sensation Psychological: alert and cooperative; normal mood and affect  Allergies  Allergen Reactions  . Other Dermatitis    Contact with stainless steal    Past Medical History:  Diagnosis Date  . Anxiety   . Hypertension    Social History   Socioeconomic History  . Marital status: Married    Spouse name: Not on file  . Number of children: Not on file  . Years of education: Not on file  . Highest education level: Not on file  Occupational History  . Not on file  Tobacco Use  . Smoking status: Never Smoker  . Smokeless tobacco: Never Used  Substance and Sexual Activity  . Alcohol use: Yes  . Drug use: Never  . Sexual activity: Not Currently  Other Topics Concern  . Not on file  Social History Narrative  . Not on file   Social Determinants of Health   Financial Resource Strain:   . Difficulty of Paying Living Expenses: Not on file  Food Insecurity:   . Worried About Charity fundraiser in the Last Year: Not on file  . Ran Out of Food in the Last Year: Not on file  Transportation Needs:   . Lack of Transportation (Medical): Not on file  . Lack of Transportation (Non-Medical): Not on file  Physical Activity:   . Days of Exercise per  Week: Not on file  . Minutes of Exercise per Session: Not on file  Stress:   . Feeling of Stress : Not on file  Social Connections:   . Frequency of Communication with Friends and Family: Not on file  . Frequency of Social Gatherings with Friends and Family: Not on file  . Attends Religious Services: Not on file  . Active Member of Clubs or Organizations: Not on file  . Attends Archivist Meetings: Not on file  . Marital Status: Not on file   Family History  Problem Relation Age of Onset  . Cancer Mother 24        unknown primary   . Diabetes Mother   . Hypertension Mother   . Miscarriages / [place] Mother   . Stroke Mother   . Stroke Father   . Hypertension Father   . Hearing loss Father   . Diabetes Father   . Arthritis Father   . Arthritis Maternal Grandmother   . Diabetes Maternal Grandmother   . Hearing loss Maternal Grandmother   . Hypertension Maternal Grandmother   . Alcohol abuse Maternal Grandfather   . Arthritis Paternal Grandmother   . Heart disease Paternal Grandmother   . Heart attack Paternal Grandmother   . Hyperlipidemia Paternal Grandmother   . Heart disease Paternal Grandfather   . Heart attack Paternal Grandfather   . Hyperlipidemia Paternal Grandfather   . Colon cancer Neg Hx   . Esophageal cancer Neg Hx   . Rectal cancer Neg Hx   . Stomach cancer Neg Hx    Past Surgical History:  Procedure Laterality Date  . LAPAROSCOPIC HYSTERECTOMY             Vanessa Kick, MD 01/03/20 [phone]

## 2020-01-28 ENCOUNTER — Ambulatory Visit: Payer: BC Managed Care – PPO | Attending: Internal Medicine

## 2020-01-28 DIAGNOSIS — Z23 Encounter for immunization: Secondary | ICD-10-CM | POA: Insufficient documentation

## 2020-01-28 NOTE — Progress Notes (Signed)
   Covid-19 Vaccination Clinic  Name:  Savannah Simon    MRN: [id number] DOB: 05/22/1956  01/28/2020  [name] was observed post Covid-19 immunization for 15 minutes without incident. She was provided with Vaccine Information Sheet and instruction to access the V-Safe system.   [name] was instructed to call 911 with any severe reactions post vaccine: Marland Kitchen Difficulty breathing  . Swelling of face and throat  . A fast heartbeat  . A bad rash all over body  . Dizziness and weakness   Immunizations Administered    Name Date Dose VIS Date Route   Pfizer COVID-19 Vaccine 01/28/2020  8:32 AM 0.3 mL 11/03/2019 Intramuscular   Manufacturer: [place]   Lot: [id number]   [place]: [id number]

## 2020-02-21 ENCOUNTER — Ambulatory Visit: Payer: BC Managed Care – PPO | Attending: Internal Medicine

## 2020-02-21 DIAGNOSIS — Z23 Encounter for immunization: Secondary | ICD-10-CM

## 2020-02-21 NOTE — Progress Notes (Signed)
   Covid-19 Vaccination Clinic  Name:  Savannah Simon    MRN: [id number] DOB: September 09, 1956  02/21/2020  [name] was observed post Covid-19 immunization for 15 minutes without incident. She was provided with Vaccine Information Sheet and instruction to access the V-Safe system.   [name] was instructed to call 911 with any severe reactions post vaccine: Marland Kitchen Difficulty breathing  . Swelling of face and throat  . A fast heartbeat  . A bad rash all over body  . Dizziness and weakness   Immunizations Administered    Name Date Dose VIS Date Route   Pfizer COVID-19 Vaccine 02/21/2020  8:07 AM 0.3 mL 11/03/2019 Intramuscular   Manufacturer: [place]   Lot: [id number]   [place]: [id number]

## 2020-08-21 ENCOUNTER — Telehealth: Payer: Self-pay | Admitting: Family Medicine

## 2020-08-21 ENCOUNTER — Other Ambulatory Visit: Payer: Self-pay

## 2020-08-21 DIAGNOSIS — I10 Essential (primary) hypertension: Secondary | ICD-10-CM

## 2020-08-21 MED ORDER — LOSARTAN POTASSIUM 100 MG PO TABS: 100.0000 mg | ORAL_TABLET | Freq: Every day | ORAL | 0 refills | Status: DC

## 2020-08-21 NOTE — Telephone Encounter (Signed)
Spoke with pt requested for a 90 days supply since she is [place] a [place]. Rx sent to requested pharmacy

## 2020-08-21 NOTE — Telephone Encounter (Signed)
Patient needs a refill on Losartan.  She is completely out.  She thought the Tylenol was Losartan so that is why she ran out.   Patient will be out of state for 2 months.  She made an appointment to see Dr Volanda Napoleon.   Pharmacy- [facility], [place]

## 2020-10-07 ENCOUNTER — Encounter: Payer: Self-pay | Admitting: Family Medicine

## 2020-10-07 ENCOUNTER — Other Ambulatory Visit: Payer: Self-pay

## 2020-10-07 ENCOUNTER — Ambulatory Visit: Payer: BC Managed Care – PPO | Admitting: Family Medicine

## 2020-10-07 VITALS — BP 120/78 | HR 83 | Temp 98.1°F | Ht 65.0 in | Wt 244.0 lb

## 2020-10-07 DIAGNOSIS — R200 Anesthesia of skin: Secondary | ICD-10-CM

## 2020-10-07 DIAGNOSIS — Z0000 Encounter for general adult medical examination without abnormal findings: Secondary | ICD-10-CM | POA: Diagnosis not present

## 2020-10-07 DIAGNOSIS — Z23 Encounter for immunization: Secondary | ICD-10-CM

## 2020-10-07 DIAGNOSIS — I10 Essential (primary) hypertension: Secondary | ICD-10-CM | POA: Diagnosis not present

## 2020-10-07 DIAGNOSIS — F419 Anxiety disorder, unspecified: Secondary | ICD-10-CM

## 2020-10-07 DIAGNOSIS — R7303 Prediabetes: Secondary | ICD-10-CM | POA: Diagnosis not present

## 2020-10-07 DIAGNOSIS — Z1159 Encounter for screening for other viral diseases: Secondary | ICD-10-CM

## 2020-10-07 DIAGNOSIS — Z0001 Encounter for general adult medical examination with abnormal findings: Secondary | ICD-10-CM

## 2020-10-07 DIAGNOSIS — E782 Mixed hyperlipidemia: Secondary | ICD-10-CM

## 2020-10-07 MED ORDER — HYDROCHLOROTHIAZIDE 12.5 MG PO TABS: 12.5000 mg | ORAL_TABLET | Freq: Every day | ORAL | 3 refills | Status: DC

## 2020-10-07 MED ORDER — FLUCONAZOLE 150 MG PO TABS: ORAL_TABLET | ORAL | 2 refills | Status: DC

## 2020-10-07 MED ORDER — LOSARTAN POTASSIUM 100 MG PO TABS: 100.0000 mg | ORAL_TABLET | Freq: Every day | ORAL | 3 refills | Status: DC

## 2020-10-07 MED ORDER — METOPROLOL SUCCINATE ER 50 MG PO TB24: 50.0000 mg | ORAL_TABLET | Freq: Every day | ORAL | 3 refills | Status: DC

## 2020-10-07 NOTE — Patient Instructions (Signed)
Preventive Care 40-63 Years Old, Female Preventive care refers to visits with your health care provider and lifestyle choices that can promote health and wellness. This includes:  A yearly physical exam. This may also be called an annual well check.  Regular dental visits and eye exams.  Immunizations.  Screening for certain conditions.  Healthy lifestyle choices, such as eating a healthy diet, getting regular exercise, not using drugs or products that contain nicotine and tobacco, and limiting alcohol use. What can I expect for my preventive care visit? Physical exam Your health care provider will check your:  Height and weight. This may be used to calculate body mass index (BMI), which tells if you are at a healthy weight.  Heart rate and blood pressure.  Skin for abnormal spots. Counseling Your health care provider may ask you questions about your:  Alcohol, tobacco, and drug use.  Emotional well-being.  Home and relationship well-being.  Sexual activity.  Eating habits.  Work and work environment.  Method of birth control.  Menstrual cycle.  Pregnancy history. What immunizations do I need?  Influenza (flu) vaccine  This is recommended every year. Tetanus, diphtheria, and pertussis (Tdap) vaccine  You may need a Td booster every 10 years. Varicella (chickenpox) vaccine  You may need this if you have not been vaccinated. Zoster (shingles) vaccine  You may need this after age 60. Measles, mumps, and rubella (MMR) vaccine  You may need at least one dose of MMR if you were born in 1957 or later. You may also need a second dose. Pneumococcal conjugate (PCV13) vaccine  You may need this if you have certain conditions and were not previously vaccinated. Pneumococcal polysaccharide (PPSV23) vaccine  You may need one or two doses if you smoke cigarettes or if you have certain conditions. Meningococcal conjugate (MenACWY) vaccine  You may need this if you  have certain conditions. Hepatitis A vaccine  You may need this if you have certain conditions or if you travel or work in places where you may be exposed to hepatitis A. Hepatitis B vaccine  You may need this if you have certain conditions or if you travel or work in places where you may be exposed to hepatitis B. Haemophilus influenzae type b (Hib) vaccine  You may need this if you have certain conditions. Human papillomavirus (HPV) vaccine  If recommended by your health care provider, you may need three doses over 6 months. You may receive vaccines as individual doses or as more than one vaccine together in one shot (combination vaccines). Talk with your health care provider about the risks and benefits of combination vaccines. What tests do I need? Blood tests  Lipid and cholesterol levels. These may be checked every 5 years, or more frequently if you are over 50 years old.  Hepatitis C test.  Hepatitis B test. Screening  Lung cancer screening. You may have this screening every year starting at age 55 if you have a 30-pack-year history of smoking and currently smoke or have quit within the past 15 years.  Colorectal cancer screening. All adults should have this screening starting at age 50 and continuing until age 75. Your health care provider may recommend screening at age 45 if you are at increased risk. You will have tests every 1-10 years, depending on your results and the type of screening test.  Diabetes screening. This is done by checking your blood sugar (glucose) after you have not eaten for a while (fasting). You may have this   done every 1-3 years.  Mammogram. This may be done every 1-2 years. Talk with your health care provider about when you should start having regular mammograms. This may depend on whether you have a family history of breast cancer.  BRCA-related cancer screening. This may be done if you have a family history of breast, ovarian, tubal, or peritoneal  cancers.  Pelvic exam and Pap test. This may be done every 3 years starting at age 63. Starting at age 12, this may be done every 5 years if you have a Pap test in combination with an HPV test. Other tests  Sexually transmitted disease (STD) testing.  Bone density scan. This is done to screen for osteoporosis. You may have this scan if you are at high risk for osteoporosis. Follow these instructions at home: Eating and drinking  Eat a diet that includes fresh fruits and vegetables, whole grains, lean protein, and low-fat dairy.  Take vitamin and mineral supplements as recommended by your health care provider.  Do not drink alcohol if: ? Your health care provider tells you not to drink. ? You are pregnant, may be pregnant, or are planning to become pregnant.  If you drink alcohol: ? Limit how much you have to 0-1 drink a day. ? Be aware of how much alcohol is in your drink. In the [place] one drink equals one 12 oz bottle of beer (355 mL), one 5 oz glass of wine (148 mL), or one 1 oz glass of hard liquor (44 mL). Lifestyle  Take daily care of your teeth and gums.  Stay active. Exercise for at least 30 minutes on 5 or more days each week.  Do not use any products that contain nicotine or tobacco, such as cigarettes, e-cigarettes, and chewing tobacco. If you need help quitting, ask your health care provider.  If you are sexually active, practice safe sex. Use a condom or other form of birth control (contraception) in order to prevent pregnancy and STIs (sexually transmitted infections).  If told by your health care provider, take low-dose aspirin daily starting at age 63. What's next?  Visit your health care provider once a year for a well check visit.  Ask your health care provider how often you should have your eyes and teeth checked.  Stay up to date on all vaccines. This information is not intended to replace advice given to you by your health care provider. Make sure you  discuss any questions you have with your health care provider. Document Revised: 07/21/2018 Document Reviewed: 07/21/2018 Elsevier Patient Education  2020 [employer].  Prediabetes Prediabetes is the condition of having a blood sugar (blood glucose) level that is higher than it should be, but not high enough for you to be diagnosed with type 2 diabetes. Having prediabetes puts you at risk for developing type 2 diabetes (type 2 diabetes mellitus). Prediabetes may be called impaired glucose tolerance or impaired fasting glucose. Prediabetes usually does not cause symptoms. Your health care provider can diagnose this condition with blood tests. You may be tested for prediabetes if you are overweight and if you have at least one other risk factor for prediabetes. What is blood glucose, and how is it measured? Blood glucose refers to the amount of glucose in your bloodstream. Glucose comes from eating foods that contain sugars and starches (carbohydrates), which the body breaks down into glucose. Your blood glucose level may be measured in mg/dL (milligrams per deciliter) or mmol/L (millimoles per liter). Your blood glucose may  be checked with one or more of the following blood tests:  A fasting blood glucose (FBG) test. You will not be allowed to eat (you will fast) for 8 hours or longer before a blood sample is taken. ? A normal range for FBG is 70-100 mg/dl (3.9-5.6 mmol/L).  An A1c (hemoglobin A1c) blood test. This test provides information about blood glucose control over the previous 2?19months.  An oral glucose tolerance test (OGTT). This test measures your blood glucose at two times: ? After fasting. This is your baseline level. ? Two hours after you drink a beverage that contains glucose. You may be diagnosed with prediabetes:  If your FBG is 100?125 mg/dL (5.6-6.9 mmol/L).  If your A1c level is 5.7?6.4%.  If your OGTT result is 140?199 mg/dL (7.8-11 mmol/L). These blood tests may be  repeated to confirm your diagnosis. How can this condition affect me? The pancreas produces a hormone (insulin) that helps to move glucose from the bloodstream into cells. When cells in the body do not respond properly to insulin that the body makes (insulin resistance), excess glucose builds up in the blood instead of going into cells. As a result, high blood glucose (hyperglycemia) can develop, which can cause many complications. Hyperglycemia is a symptom of prediabetes. Having high blood glucose for a long time is dangerous. Too much glucose in your blood can damage your nerves and blood vessels. Long-term damage can lead to complications from diabetes, which may include:  Heart disease.  Stroke.  Blindness.  Kidney disease.  Depression.  Poor circulation in the feet and legs, which could lead to surgical removal (amputation) in severe cases. What can increase my risk? Risk factors for prediabetes include:  Having a family member with type 2 diabetes.  Being overweight or obese.  Being older than age 6.  Being of American [place], African-American, Hispanic/Latino, or Asian/Pacific Islander descent.  Having an inactive (sedentary) lifestyle.  Having a history of heart disease.  History of gestational diabetes or polycystic ovary syndrome (PCOS), in women.  Having low levels of good cholesterol (HDL-C) or high levels of blood fats (triglycerides).  Having high blood pressure. What actions can I take to prevent diabetes?      Be physically active. ? Do moderate-intensity physical activity for 30 or more minutes on 5 or more days of the week, or as much as told by your health care provider. This could be brisk walking, biking, or water aerobics. ? Ask your health care provider what activities are safe for you. A mix of physical activities may be best, such as walking, swimming, cycling, and strength training.  Lose weight as told by your health care provider. ? Losing  5-7% of your body weight can reverse insulin resistance. ? Your health care provider can determine how much weight loss is best for you and can help you lose weight safely.  Follow a healthy meal plan. This includes eating lean proteins, complex carbohydrates, fresh fruits and vegetables, low-fat dairy products, and healthy fats. ? Follow instructions from your health care provider about eating or drinking restrictions. ? Make an appointment to see a diet and nutrition specialist (registered dietitian) to help you create a healthy eating plan that is right for you.  Do not smoke or use any tobacco products, such as cigarettes, chewing tobacco, and e-cigarettes. If you need help quitting, ask your health care provider.  Take over-the-counter and prescription medicines as told by your health care provider. You may be prescribed medicines that  help lower the risk of type 2 diabetes.  Keep all follow-up visits as told by your health care provider. This is important. Summary  Prediabetes is the condition of having a blood sugar (blood glucose) level that is higher than it should be, but not high enough for you to be diagnosed with type 2 diabetes.  Having prediabetes puts you at risk for developing type 2 diabetes (type 2 diabetes mellitus).  To help prevent type 2 diabetes, make lifestyle changes such as being physically active and eating a healthy diet. Lose weight as told by your health care provider. This information is not intended to replace advice given to you by your health care provider. Make sure you discuss any questions you have with your health care provider. Document Revised: 03/03/2019 Document Reviewed: 12/31/2015 Elsevier Patient Education  2020 Elsevier Inc.  Managing Anxiety, Adult After being diagnosed with an anxiety disorder, you may be relieved to know why you have felt or behaved a certain way. You may also feel overwhelmed about the treatment ahead and what it will mean  for your life. With care and support, you can manage this condition and recover from it. How to manage lifestyle changes Managing stress and anxiety  Stress is your body's reaction to life changes and events, both good and bad. Most stress will last just a few hours, but stress can be ongoing and can lead to more than just stress. Although stress can play a major role in anxiety, it is not the same as anxiety. Stress is usually caused by something external, such as a deadline, test, or competition. Stress normally passes after the triggering event has ended.  Anxiety is caused by something internal, such as imagining a terrible outcome or worrying that something will go wrong that will devastate you. Anxiety often does not go away even after the triggering event is over, and it can become long-term (chronic) worry. It is important to understand the differences between stress and anxiety and to manage your stress effectively so that it does not lead to an anxious response. Talk with your health care provider or a counselor to learn more about reducing anxiety and stress. He or she may suggest tension reduction techniques, such as:  Music therapy. This can include creating or listening to music that you enjoy and that inspires you.  Mindfulness-based meditation. This involves being aware of your normal breaths while not trying to control your breathing. It can be done while sitting or walking.  Centering prayer. This involves focusing on a word, phrase, or sacred image that means something to you and brings you peace.  Deep breathing. To do this, expand your stomach and inhale slowly through your nose. Hold your breath for 3-5 seconds. Then exhale slowly, letting your stomach muscles relax.  Self-talk. This involves identifying thought patterns that lead to anxiety reactions and changing those patterns.  Muscle relaxation. This involves tensing muscles and then relaxing them. Choose a tension  reduction technique that suits your lifestyle and personality. These techniques take time and practice. Set aside 5-15 minutes a day to do them. Therapists can offer counseling and training in these techniques. The training to help with anxiety may be covered by some insurance plans. Other things you can do to manage stress and anxiety include:  Keeping a stress/anxiety diary. This can help you learn what triggers your reaction and then learn ways to manage your response.  Thinking about how you react to certain situations. You may not be  able to control everything, but you can control your response.  Making time for activities that help you relax and not feeling guilty about spending your time in this way.  Visual imagery and yoga can help you stay calm and relax.  Medicines Medicines can help ease symptoms. Medicines for anxiety include:  Anti-anxiety drugs.  Antidepressants. Medicines are often used as a primary treatment for anxiety disorder. Medicines will be prescribed by a health care provider. When used together, medicines, psychotherapy, and tension reduction techniques may be the most effective treatment. Relationships Relationships can play a big part in helping you recover. Try to spend more time connecting with trusted friends and family members. Consider going to couples counseling, taking family education classes, or going to family therapy. Therapy can help you and others better understand your condition. How to recognize changes in your anxiety Everyone responds differently to treatment for anxiety. Recovery from anxiety happens when symptoms decrease and stop interfering with your daily activities at home or work. This may mean that you will start to:  Have better concentration and focus. Worry will interfere less in your daily thinking.  Sleep better.  Be less irritable.  Have more energy.  Have improved memory. It is important to recognize when your condition is  getting worse. Contact your health care provider if your symptoms interfere with home or work and you feel like your condition is not improving. Follow these instructions at home: Activity  Exercise. Most adults should do the following: ? Exercise for at least 150 minutes each week. The exercise should increase your heart rate and make you sweat (moderate-intensity exercise). ? Strengthening exercises at least twice a week.  Get the right amount and quality of sleep. Most adults need 7-9 hours of sleep each night. Lifestyle   Eat a healthy diet that includes plenty of vegetables, fruits, whole grains, low-fat dairy products, and lean protein. Do not eat a lot of foods that are high in solid fats, added sugars, or salt.  Make choices that simplify your life.  Do not use any products that contain nicotine or tobacco, such as cigarettes, e-cigarettes, and chewing tobacco. If you need help quitting, ask your health care provider.  Avoid caffeine, alcohol, and certain over-the-counter cold medicines. These may make you feel worse. Ask your pharmacist which medicines to avoid. General instructions  Take over-the-counter and prescription medicines only as told by your health care provider.  Keep all follow-up visits as told by your health care provider. This is important. Where to find support You can get help and support from these sources:  Self-help groups.  Online and [employer].  A trusted spiritual leader.  Couples counseling.  Family education classes.  Family therapy. Where to find more information You may find that joining a support group helps you deal with your anxiety. The following sources can help you locate counselors or support groups near you:  Mental Health America: www.mentalhealthamerica.net  Anxiety and Depression Association of [place] (ADAA): ProgramCam.de  [employer] Mental Illness (NAMI): www.nami.org Contact a health care provider  if you:  Have a hard time staying focused or finishing daily tasks.  Spend many hours a day feeling worried about everyday life.  Become exhausted by worry.  Start to have headaches, feel tense, or have nausea.  Urinate more than normal.  Have diarrhea. Get help right away if you have:  A racing heart and shortness of breath.  Thoughts of hurting yourself or others. If you ever feel  like you may hurt yourself or others, or have thoughts about taking your own life, get help right away. You can go to your nearest emergency department or call:  Your local emergency services (911 in the [place]  A suicide crisis helpline, such as the [facility] at 574-200-7334. This is open 24 hours a day. Summary  Taking steps to learn and use tension reduction techniques can help calm you and help prevent triggering an anxiety reaction.  When used together, medicines, psychotherapy, and tension reduction techniques may be the most effective treatment.  Family, friends, and partners can play a big part in helping you recover from an anxiety disorder. This information is not intended to replace advice given to you by your health care provider. Make sure you discuss any questions you have with your health care provider. Document Revised: 04/11/2019 Document Reviewed: 04/11/2019 Elsevier Patient Education  [place].  Peripheral Neuropathy Peripheral neuropathy is a type of nerve damage. It affects nerves that carry signals between the spinal cord and the arms, legs, and the rest of the body (peripheral nerves). It does not affect nerves in the spinal cord or brain. In peripheral neuropathy, one nerve or a group of nerves may be damaged. Peripheral neuropathy is a broad category that includes many specific nerve disorders, like diabetic neuropathy, hereditary neuropathy, and carpal tunnel syndrome. What are the causes? This condition may be caused by:  Diabetes.  This is the most common cause of peripheral neuropathy.  Nerve injury.  Pressure or stress on a nerve that lasts a long time.  Lack (deficiency) of B vitamins. This can result from alcoholism, poor diet, or a restricted diet.  Infections.  Autoimmune diseases, such as rheumatoid arthritis and systemic lupus erythematosus.  Nerve diseases that are passed from parent to child (inherited).  Some medicines, such as cancer medicines (chemotherapy).  Poisonous (toxic) substances, such as lead and mercury.  Too little blood flowing to the legs.  Kidney disease.  Thyroid disease. In some cases, the cause of this condition is not known. What are the signs or symptoms? Symptoms of this condition depend on which of your nerves is damaged. Common symptoms include:  Loss of feeling (numbness) in the feet, hands, or both.  Tingling in the feet, hands, or both.  Burning pain.  Very sensitive skin.  Weakness.  Not being able to move a part of the body (paralysis).  Muscle twitching.  Clumsiness or poor coordination.  Loss of balance.  Not being able to control your bladder.  Feeling dizzy.  Sexual problems. How is this diagnosed? Diagnosing and finding the cause of peripheral neuropathy can be difficult. Your health care provider will take your medical history and do a physical exam. A neurological exam will also be done. This involves checking things that are affected by your brain, spinal cord, and nerves (nervous system). For example, your health care provider will check your reflexes, how you move, and what you can feel. You may have other tests, such as:  Blood tests.  Electromyogram (EMG) and nerve conduction tests. These tests check nerve function and how well the nerves are controlling the muscles.  Imaging tests, such as CT scans or MRI to rule out other causes of your symptoms.  Removing a small piece of nerve to be examined in a lab (nerve biopsy). This is  rare.  Removing and examining a small amount of the fluid that surrounds the brain and spinal cord (lumbar puncture). This is  rare. How is this treated? Treatment for this condition may involve:  Treating the underlying cause of the neuropathy, such as diabetes, kidney disease, or vitamin deficiencies.  Stopping medicines that can cause neuropathy, such as chemotherapy.  Medicine to relieve pain. Medicines may include: ? Prescription or over-the-counter pain medicine. ? Antiseizure medicine. ? Antidepressants. ? Pain-relieving patches that are applied to painful areas of skin.  Surgery to relieve pressure on a nerve or to destroy a nerve that is causing pain.  Physical therapy to help improve movement and balance.  Devices to help you move around (assistive devices). Follow these instructions at home: Medicines  Take over-the-counter and prescription medicines only as told by your health care provider. Do not take any other medicines without first asking your health care provider.  Do not drive or use heavy machinery while taking prescription pain medicine. Lifestyle   Do not use any products that contain nicotine or tobacco, such as cigarettes and e-cigarettes. Smoking keeps blood from reaching damaged nerves. If you need help quitting, ask your health care provider.  Avoid or limit alcohol. Too much alcohol can cause a vitamin B deficiency, and vitamin B is needed for healthy nerves.  Eat a healthy diet. This includes: ? Eating foods that are high in fiber, such as fresh fruits and vegetables, whole grains, and beans. ? Limiting foods that are high in fat and processed sugars, such as fried or sweet foods. General instructions   If you have diabetes, work closely with your health care provider to keep your blood sugar under control.  If you have numbness in your feet: ? Check every day for signs of injury or infection. Watch for redness, warmth, and swelling. ? Wear  padded socks and comfortable shoes. These help protect your feet.  Develop a good support system. Living with peripheral neuropathy can be stressful. Consider talking with a mental health specialist or joining a support group.  Use assistive devices and attend physical therapy as told by your health care provider. This may include using a walker or a cane.  Keep all follow-up visits as told by your health care provider. This is important. Contact a health care provider if:  You have new signs or symptoms of peripheral neuropathy.  You are struggling emotionally from dealing with peripheral neuropathy.  Your pain is not well-controlled. Get help right away if:  You have an injury or infection that is not healing normally.  You develop new weakness in an arm or leg.  You fall frequently. Summary  Peripheral neuropathy is when the nerves in the arms, or legs are damaged, resulting in numbness, weakness, or pain.  There are many causes of peripheral neuropathy, including diabetes, pinched nerves, vitamin deficiencies, autoimmune disease, and hereditary conditions.  Diagnosing and finding the cause of peripheral neuropathy can be difficult. Your health care provider will take your medical history, do a physical exam, and do tests, including blood tests and nerve function tests.  Treatment involves treating the underlying cause of the neuropathy and taking medicines to help control pain. Physical therapy and assistive devices may also help. This information is not intended to replace advice given to you by your health care provider. Make sure you discuss any questions you have with your health care provider. Document Revised: 10/22/2017 Document Reviewed: 01/18/2017 Elsevier Patient Education  2020 [employer].

## 2020-10-07 NOTE — Progress Notes (Signed)
Subjective:     Savannah Simon is a 64 y.o. female and is here for a comprehensive physical exam. Pt doing well overall.  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  Patient spending time in [place] and here in [place].  Recently drove back down.  Long car rides cause yeast infection.  Pt tried OTC meds with some improvement.  Patient requesting refills on hydrochlorothiazide 12.5 mg losartan 100 mg and Toprol-XL 50 mg.  No longer taking propranolol.  Patient states BP has been controlled.  Pt notes intermittent numbness in toes with certain shoes.   Social History   Socioeconomic History  . Marital status: Married    Spouse name: Not on file  . Number of children: Not on file  . Years of education: Not on file  . Highest education level: Not on file  Occupational History  . Not on file  Tobacco Use  . Smoking status: Never Smoker  . Smokeless tobacco: Never Used  Substance and Sexual Activity  . Alcohol use: Yes  . Drug use: Never  . Sexual activity: Not Currently  Other Topics Concern  . Not on file  Social History Narrative  . Not on file   Social Determinants of Health   Financial Resource Strain:   . Difficulty of Paying Living Expenses: Not on file  Food Insecurity:   . Worried About Charity fundraiser in the Last Year: Not on file  . Ran Out of Food in the Last Year: Not on file  Transportation Needs:   . Lack of Transportation (Medical): Not on file  . Lack of Transportation (Non-Medical): Not on file  Physical Activity:   . Days of Exercise per Week: Not on file  . Minutes of Exercise per Session: Not on file  Stress:   . Feeling of Stress : Not on file  Social Connections:   . Frequency of Communication with Friends and Family: Not on file  . Frequency of Social Gatherings with Friends and Family: Not on file  . Attends Religious Services: Not on file  . Active Member of Clubs or Organizations: Not on file  .  Attends Archivist Meetings: Not on file  . Marital Status: Not on file  Intimate Partner Violence:   . Fear of Current or Ex-Partner: Not on file  . Emotionally Abused: Not on file  . Physically Abused: Not on file  . Sexually Abused: Not on file   Health Maintenance  Topic Date Due  . Hepatitis C Screening  Never done  . HIV Screening  Never done  . PAP SMEAR-Modifier  Never done  . COLONOSCOPY  Never done  . MAMMOGRAM  10/27/2019  . INFLUENZA VACCINE  06/23/2020  . TETANUS/TDAP  07/23/2026  . COVID-19 Vaccine  Completed    The following portions of the patient's history were reviewed and updated as appropriate: allergies, current medications, past family history, past medical history, past social history, past surgical history and problem list.  Review of Systems Pertinent items noted in HPI and remainder of comprehensive ROS otherwise negative.   Objective:    BP 120/78 (BP Location: Left Arm, Patient Position: Sitting, Cuff Size: Large)   Pulse 83   Temp 98.1 F (36.7 C) (Oral)   Ht _0  (1.651 m)   Wt 244 lb (110.7 kg)   SpO2 97%   BMI 40.60 kg/m  General appearance: alert, cooperative and no distress Head: Normocephalic, without obvious abnormality,  atraumatic Eyes: conjunctivae/corneas clear. PERRL, EOM's intact. Fundi benign. Ears: normal TM's and external ear canals both ears Nose: Nares normal. Septum midline. Mucosa normal. No drainage or sinus tenderness. Throat: lips, mucosa, and tongue normal; teeth and gums normal Neck: no adenopathy, no carotid bruit, no JVD, supple, symmetrical, trachea midline and thyroid not enlarged, symmetric, no tenderness/mass/nodules Lungs: clear to auscultation bilaterally Heart: regular rate and rhythm, S1, S2 normal, no murmur, click, rub or gallop  2+ DP and PT pulses.   Abdomen: soft, non-tender; bowel sounds normal; no masses,  no organomegaly Extremities: extremities normal, atraumatic, no cyanosis or  edema Pulses: 2+ and symmetric Skin: Skin color, texture, turgor normal. No rashes or lesions Lymph nodes: Cervical, supraclavicular, and axillary nodes normal. Neurologic: Alert and oriented X 3, normal strength and tone. Normal symmetric reflexes. Normal coordination and gait    Assessment:    Healthy female exam with numbness in toes.     Plan:     Anticipatory guidance given including wearing seatbelts, smoke detectors in the home, increasing physical activity, increasing p.o. intake of water and vegetables. -will obtain labs -pt to schedule mammogram -pap due.  Pt wishes to wait. -discussed colonoscopy.  Pt to contact office in the next few months when ready to schedule. -given handout -next CPE in 1 yr See After Visit Summary for Counseling Recommendations    Essential hypertension  -Controlled -continue lifestyle modifications -continue current medications including losartan 100 mg, HCTZ 12.5 mg, and Toprol XL 50 mg daily - Plan: CMP with eGFR(Quest), CMP with eGFR(Quest), hydrochlorothiazide (HYDRODIURIL) 12.5 MG tablet, losartan (COZAAR) 100 MG tablet, metoprolol succinate (TOPROL-XL) 50 MG 24 hr tablet  Mixed hyperlipidemia -discussed lifestyle modifications  -continue fish oil capsules - Plan: Lipid panel  Prediabetes  -last hgb A1C 6.3% -lifestyle modifications encouraged including increasing physical activity - Plan: Hemoglobin A1c  Numbness  -likely 2/2 flat shoes with narrow toe box -consider other causes including vitamin def., thyroid dysfunction. -will obtain labs. - Plan: CBC with Differential/Platelet, TSH, Vitamin B12, Folate, T4, free  Anxiety -GAD 7 score 7 -PHQ 9 score 11 -continue self care -consider counseling -will continue to monitor  Encounter for hepatitis C screening test for low risk patient  - Plan: Hep C Antibody, Hep C Antibody  Need for immunization against influenza  - Plan: Flu Vaccine QUAD 6+ mos PF IM (Fluarix Quad  PF)  F/u in the next few months  [name], MD

## 2020-10-08 LAB — CBC WITH DIFFERENTIAL/PLATELET
Absolute Monocytes: 370 cells/uL (ref 200–950)
Basophils Absolute: 30 cells/uL (ref 0–200)
Basophils Relative: 0.4 %
Eosinophils Absolute: 141 cells/uL (ref 15–500)
Eosinophils Relative: 1.9 %
HCT: 45 % (ref 35.0–45.0)
Hemoglobin: 15.8 g/dL — ABNORMAL HIGH (ref 11.7–15.5)
Lymphs Abs: 2087 cells/uL (ref 850–3900)
MCH: 33.1 pg — ABNORMAL HIGH (ref 27.0–33.0)
MCHC: 35.1 g/dL (ref 32.0–36.0)
MCV: 94.1 fL (ref 80.0–100.0)
MPV: 10.9 fL (ref 7.5–12.5)
Monocytes Relative: 5 %
Neutro Abs: 4773 cells/uL (ref 1500–7800)
Neutrophils Relative %: 64.5 %
Platelets: 288 10*3/uL (ref 140–400)
RBC: 4.78 10*6/uL (ref 3.80–5.10)
RDW: 12.1 % (ref 11.0–15.0)
Total Lymphocyte: 28.2 %
WBC: 7.4 10*3/uL (ref 3.8–10.8)

## 2020-10-08 LAB — COMPLETE METABOLIC PANEL WITH GFR
AG Ratio: 1.6 (calc) (ref 1.0–2.5)
ALT: 45 U/L — ABNORMAL HIGH (ref 6–29)
AST: 37 U/L — ABNORMAL HIGH (ref 10–35)
Albumin: 4.7 g/dL (ref 3.6–5.1)
Alkaline phosphatase (APISO): 93 U/L (ref 37–153)
BUN: 19 mg/dL (ref 7–25)
CO2: 23 mmol/L (ref 20–32)
Calcium: 10 mg/dL (ref 8.6–10.4)
Chloride: 101 mmol/L (ref 98–110)
Creat: 0.77 mg/dL (ref 0.50–0.99)
GFR, Est African American: 95 mL/min/{1.73_m2} (ref >=60)
GFR, Est Non African American: 82 mL/min/{1.73_m2} (ref >=60)
Globulin: 2.9 g/dL (calc) (ref 1.9–3.7)
Glucose, Bld: 117 mg/dL — ABNORMAL HIGH (ref 65–99)
Potassium: 4.2 mmol/L (ref 3.5–5.3)
Sodium: 138 mmol/L (ref 135–146)
Total Bilirubin: 0.7 mg/dL (ref 0.2–1.2)
Total Protein: 7.6 g/dL (ref 6.1–8.1)

## 2020-10-08 LAB — LIPID PANEL
Cholesterol: 228 mg/dL — ABNORMAL HIGH (ref <200)
HDL: 49 mg/dL — ABNORMAL LOW (ref >=50)
LDL Cholesterol (Calc): 143 mg/dL (calc) — ABNORMAL HIGH
Non-HDL Cholesterol (Calc): 179 mg/dL (calc) — ABNORMAL HIGH (ref <130)
Total CHOL/HDL Ratio: 4.7 (calc) (ref <5.0)
Triglycerides: 217 mg/dL — ABNORMAL HIGH (ref <150)

## 2020-10-08 LAB — HEPATITIS C ANTIBODY
Hepatitis C Ab: NONREACTIVE
SIGNAL TO CUT-OFF: 0.01 (ref <1.00)

## 2020-10-08 LAB — TSH: TSH: 1.93 mIU/L (ref 0.40–4.50)

## 2020-10-08 LAB — HEMOGLOBIN A1C
Hgb A1c MFr Bld: 6.2 % of total Hgb — ABNORMAL HIGH (ref <5.7)
Mean Plasma Glucose: 131 (calc)
eAG (mmol/L): 7.3 (calc)

## 2020-10-08 LAB — FOLATE: Folate: >24 ng/mL

## 2020-10-08 LAB — T4, FREE: Free T4: 1.3 ng/dL (ref 0.8–1.8)

## 2020-10-08 LAB — VITAMIN B12: Vitamin B-12: 740 pg/mL (ref 200–1100)

## 2020-10-14 ENCOUNTER — Other Ambulatory Visit: Payer: Self-pay

## 2020-10-14 DIAGNOSIS — Z1211 Encounter for screening for malignant neoplasm of colon: Secondary | ICD-10-CM

## 2020-10-15 ENCOUNTER — Encounter: Payer: Self-pay | Admitting: Family Medicine

## 2020-10-21 ENCOUNTER — Other Ambulatory Visit: Payer: Self-pay | Admitting: Family Medicine

## 2020-10-21 DIAGNOSIS — R7989 Other specified abnormal findings of blood chemistry: Secondary | ICD-10-CM

## 2020-10-28 ENCOUNTER — Other Ambulatory Visit: Payer: Self-pay | Admitting: Family Medicine

## 2020-10-28 DIAGNOSIS — Z1231 Encounter for screening mammogram for malignant neoplasm of breast: Secondary | ICD-10-CM

## 2020-12-02 ENCOUNTER — Ambulatory Visit
Admission: RE | Admit: 2020-12-02 | Discharge: 2020-12-02 | Disposition: A | Discharge status: Home / Self Care | Payer: BC Managed Care – PPO | Source: Ambulatory Visit | Attending: Family Medicine | Admitting: Family Medicine

## 2020-12-02 DIAGNOSIS — R7989 Other specified abnormal findings of blood chemistry: Secondary | ICD-10-CM

## 2020-12-10 ENCOUNTER — Ambulatory Visit: Payer: BC Managed Care – PPO

## 2020-12-18 ENCOUNTER — Other Ambulatory Visit: Payer: Self-pay | Admitting: Family Medicine

## 2020-12-18 DIAGNOSIS — Z1231 Encounter for screening mammogram for malignant neoplasm of breast: Secondary | ICD-10-CM

## 2020-12-20 ENCOUNTER — Other Ambulatory Visit: Payer: Self-pay

## 2020-12-20 ENCOUNTER — Ambulatory Visit
Admission: RE | Admit: 2020-12-20 | Discharge: 2020-12-20 | Disposition: A | Discharge status: Home / Self Care | Payer: Self-pay | Source: Ambulatory Visit

## 2020-12-20 DIAGNOSIS — Z1231 Encounter for screening mammogram for malignant neoplasm of breast: Secondary | ICD-10-CM

## 2020-12-21 LAB — HM MAMMOGRAPHY

## 2020-12-27 ENCOUNTER — Encounter: Payer: Self-pay | Admitting: Family Medicine

## 2021-01-16 ENCOUNTER — Other Ambulatory Visit: Payer: Self-pay

## 2021-01-16 ENCOUNTER — Ambulatory Visit (AMBULATORY_SURGERY_CENTER): Payer: Self-pay

## 2021-01-16 VITALS — Ht 65.0 in | Wt 239.0 lb

## 2021-01-16 DIAGNOSIS — Z1211 Encounter for screening for malignant neoplasm of colon: Secondary | ICD-10-CM

## 2021-01-16 MED ORDER — SUTAB 1479-225-188 MG PO TABS: 1.0000 | ORAL_TABLET | ORAL | 0 refills | Status: DC

## 2021-01-16 NOTE — Progress Notes (Signed)
No egg or soy allergy known to patient  No issues with past sedation with any surgeries or procedures No intubation problems in the past  No FH of Malignant Hyperthermia No diet pills per patient No home 02 use per patient  No blood thinners per patient  Pt reports issues with constipation --advised to complete Miralax x 5 days prior to prep and to increase water intake x 5 days prior as well; No A fib or A flutter  EMMI video via MyChart  COVID 19 guidelines implemented in PV today with Pt and RN  Pt is fully vaccinated for Covid x 2; Pt denies loose teeth, dentures, partials, dental implants, capped or bonded teeth; Patient reports missing teeth; Coupon given to pt in PV today, Code to Pharmacy and  NO PA's for preps discussed with pt in PV today  Discussed with pt there will be an out-of-pocket cost for prep and that varies from $0 to 70 dollars  Due to the COVID-19 pandemic we are asking patients to follow certain guidelines.   Pt aware of COVID protocols and [facility] guidelines.

## 2021-01-17 ENCOUNTER — Encounter: Payer: Self-pay | Admitting: Internal Medicine

## 2021-01-30 ENCOUNTER — Other Ambulatory Visit: Payer: Self-pay

## 2021-01-30 ENCOUNTER — Encounter: Payer: Self-pay | Admitting: Internal Medicine

## 2021-01-30 ENCOUNTER — Ambulatory Visit (AMBULATORY_SURGERY_CENTER): Payer: BC Managed Care – PPO | Admitting: Internal Medicine

## 2021-01-30 VITALS — BP 138/75 | HR 56 | Temp 97.7°F | Resp 11 | Ht 65.0 in | Wt 239.2 lb

## 2021-01-30 DIAGNOSIS — D122 Benign neoplasm of ascending colon: Secondary | ICD-10-CM | POA: Diagnosis not present

## 2021-01-30 DIAGNOSIS — D123 Benign neoplasm of transverse colon: Secondary | ICD-10-CM

## 2021-01-30 DIAGNOSIS — Z1211 Encounter for screening for malignant neoplasm of colon: Secondary | ICD-10-CM | POA: Diagnosis not present

## 2021-01-30 MED ORDER — SODIUM CHLORIDE 0.9 % IV SOLN: 500.0000 mL | Freq: Once | INTRAVENOUS | Status: DC

## 2021-01-30 NOTE — Op Note (Signed)
[facility] Patient Name: Savannah Simon Procedure Date: 01/30/2021 1:27 PM MRN: [id number] Endoscopist: Docia Chuck. Henrene Pastor , MD Age: 65 Referring MD:  Date of Birth: [birth year] Gender: Female Account #: [id number] Procedure:                Colonoscopy with cold snare polypectomy x 2 Indications:              Screening for colorectal malignant neoplasm.                            Reports colonoscopy elsewhere 13 years ago (no                            details, but was told to follow-up in 10 years) Medicines:                Monitored Anesthesia Care Procedure:                Pre-Anesthesia Assessment:                           - Prior to the procedure, a History and Physical                            was performed, and patient medications and                            allergies were reviewed. The patient's tolerance of                            previous anesthesia was also reviewed. The risks                            and benefits of the procedure and the sedation                            options and risks were discussed with the patient.                            All questions were answered, and informed consent                            was obtained. Prior Anticoagulants: The patient has                            taken no previous anticoagulant or antiplatelet                            agents. After reviewing the risks and benefits, the                            patient was deemed in satisfactory condition to                            undergo the procedure.  After obtaining informed consent, the colonoscope                            was passed under direct vision. Throughout the                            procedure, the patient's blood pressure, pulse, and                            oxygen saturations were monitored continuously. The                            Olympus CF-HQ190 [id number]) Colonoscope was                            introduced  through the anus and advanced to the the                            cecum, identified by appendiceal orifice and                            ileocecal valve. The ileocecal valve, appendiceal                            orifice, and rectum were photographed. The quality                            of the bowel preparation was excellent. The                            colonoscopy was performed without difficulty. The                            patient tolerated the procedure well. The bowel                            preparation used was SUPREP via split dose                            instruction. Scope In: 1:42:10 PM Scope Out: 1:57:32 PM Scope Withdrawal Time: 0 hours 11 minutes 33 seconds  Total Procedure Duration: 0 hours 15 minutes 22 seconds  Findings:                 Two polyps were found in the transverse colon and                            ascending colon. The polyps were 4 to 6 mm in size.                            These polyps were removed with a cold snare.                            Resection and retrieval were complete.  Multiple small and large-mouthed diverticula were                            found in the entire colon.                           The exam was otherwise without abnormality on                            direct and retroflexion views. Complications:            No immediate complications. Estimated blood loss:                            None. Estimated Blood Loss:     Estimated blood loss: none. Impression:               - Two 4 to 6 mm polyps in the transverse colon and                            in the ascending colon, removed with a cold snare.                            Resected and retrieved.                           - Diverticulosis in the entire examined colon.                           - The examination was otherwise normal on direct                            and retroflexion views. Recommendation:           - Repeat  colonoscopy in 7 years for surveillance.                           - Patient has a contact number available for                            emergencies. The signs and symptoms of potential                            delayed complications were discussed with the                            patient. Return to normal activities tomorrow.                            Written discharge instructions were provided to the                            patient.                           - Resume previous diet.                           -  Continue present medications.                           - Await pathology results. Docia Chuck. Henrene Pastor, MD 01/30/2021 2:08:17 PM This report has been signed electronically.

## 2021-01-30 NOTE — Progress Notes (Signed)
pt tolerated well. VSS. awake and to recovery. Report given to RN.  

## 2021-01-30 NOTE — Progress Notes (Signed)
Called to room to assist during endoscopic procedure.  Patient ID and intended procedure confirmed with present staff. Received instructions for my participation in the procedure from the performing physician.  

## 2021-01-30 NOTE — Patient Instructions (Signed)
Handouts given for polyps and diverticulosis.  Await pathology results.  YOU HAD AN ENDOSCOPIC PROCEDURE TODAY AT THE [facility]:   Refer to the procedure report that was given to you for any specific questions about what was found during the examination.  If the procedure report does not answer your questions, please call your gastroenterologist to clarify.  If you requested that your care partner not be given the details of your procedure findings, then the procedure report has been included in a sealed envelope for you to review at your convenience later.  YOU SHOULD EXPECT: Some feelings of bloating in the abdomen. Passage of more gas than usual.  Walking can help get rid of the air that was put into your GI tract during the procedure and reduce the bloating. If you had a lower endoscopy (such as a colonoscopy or flexible sigmoidoscopy) you may notice spotting of blood in your stool or on the toilet paper. If you underwent a bowel prep for your procedure, you may not have a normal bowel movement for a few days.  Please Note:  You might notice some irritation and congestion in your nose or some drainage.  This is from the oxygen used during your procedure.  There is no need for concern and it should clear up in a day or so.  SYMPTOMS TO REPORT IMMEDIATELY:   Following lower endoscopy (colonoscopy or flexible sigmoidoscopy):  Excessive amounts of blood in the stool  Significant tenderness or worsening of abdominal pains  Swelling of the abdomen that is new, acute  Fever of 100F or higher  For urgent or emergent issues, a gastroenterologist can be reached at any hour by calling [phone]. Do not use MyChart messaging for urgent concerns.    DIET:  We do recommend a small meal at first, but then you may proceed to your regular diet.  Drink plenty of fluids but you should avoid alcoholic beverages for 24 hours.  ACTIVITY:  You should plan to take it easy for the rest of  today and you should NOT DRIVE or use heavy machinery until tomorrow (because of the sedation medicines used during the test).    FOLLOW UP: Our staff will call the number listed on your records 48-72 hours following your procedure to check on you and address any questions or concerns that you may have regarding the information given to you following your procedure. If we do not reach you, we will leave a message.  We will attempt to reach you two times.  During this call, we will ask if you have developed any symptoms of COVID 19. If you develop any symptoms (ie: fever, flu-like symptoms, shortness of breath, cough etc.) before then, please call [phone].  If you test positive for Covid 19 in the 2 weeks post procedure, please call and report this information to us.    If any biopsies were taken you will be contacted by phone or by letter within the next 1-3 weeks.  Please call us at [phone] if you have not heard about the biopsies in 3 weeks.    SIGNATURES/CONFIDENTIALITY: You and/or your care partner have signed paperwork which will be entered into your electronic medical record.  These signatures attest to the fact that that the information above on your After Visit Summary has been reviewed and is understood.  Full responsibility of the confidentiality of this discharge information lies with you and/or your care-partner. 

## 2021-01-30 NOTE — Progress Notes (Signed)
Medical history reviewed with no changes noted. VS assessed by C.W 

## 2021-02-04 ENCOUNTER — Telehealth: Payer: Self-pay

## 2021-02-04 NOTE — Telephone Encounter (Signed)
  Follow up Call-  Call back number 01/30/2021  Post procedure Call Back phone  # [phone]  Permission to leave phone message Yes  Some recent data might be hidden     Left mesage

## 2021-02-04 NOTE — Telephone Encounter (Signed)
First attempt follow up call to pt, lm on vm 

## 2021-02-06 ENCOUNTER — Encounter: Payer: Self-pay | Admitting: Internal Medicine

## 2021-06-11 ENCOUNTER — Other Ambulatory Visit: Payer: Self-pay | Admitting: Family Medicine

## 2021-06-11 DIAGNOSIS — I10 Essential (primary) hypertension: Secondary | ICD-10-CM

## 2021-11-28 ENCOUNTER — Telehealth: Payer: Self-pay | Admitting: Family Medicine

## 2021-11-28 NOTE — Telephone Encounter (Signed)
Pt call and stated she need a refill on losartan (COZAAR) 100 MG tablet sent to  [facility], [place] [address] Phone:  [phone]  Fax:  [phone]    Pt stated she will make a appt when she get her insurance straight out.

## 2021-12-02 ENCOUNTER — Other Ambulatory Visit: Payer: Self-pay | Admitting: Family Medicine

## 2021-12-02 DIAGNOSIS — I10 Essential (primary) hypertension: Secondary | ICD-10-CM

## 2021-12-05 ENCOUNTER — Telehealth: Payer: Self-pay | Admitting: Family Medicine

## 2021-12-05 NOTE — Telephone Encounter (Signed)
Patient called in and spoke with Ms Savannah Simon in regards to her needing a Refill of her BP medications. Ms Savannah Simon and myself explained to the patient that she needed an appointment with Provider in order to obtain the refill due to 1: not being seen since 10/07/2020 and her last refill with 90 day supply was given 06/13/2021.  Patient stated to me that if provider had given her the refill she requested in November of 2021, that she would not be without now. I continued to state to patient that she needed a office visit with Provider in order to obtain refill from Provider. Patient became extremely upset that she could not receive her medications today, even though she did not have an appointment.  I talk to patient letting her know that she really needed an appointment eventually agreed and scheduled for Monday the 16th.  Patient after confirming appointment with patient she returned to demanding refill today..  I informed patient that she could go to urgent care, or check with pharmacy to see if they could help her out until Monday.  Then patient stated she did not have insurance, I verified patients BCBS insurance while listening to patient.  I let her know that she is still eligible with her carrier.

## 2021-12-08 ENCOUNTER — Telehealth (INDEPENDENT_AMBULATORY_CARE_PROVIDER_SITE_OTHER): Payer: BC Managed Care – PPO | Admitting: Family Medicine

## 2021-12-08 ENCOUNTER — Encounter: Payer: Self-pay | Admitting: Family Medicine

## 2021-12-08 ENCOUNTER — Other Ambulatory Visit: Payer: Self-pay | Admitting: Family Medicine

## 2021-12-08 DIAGNOSIS — R0683 Snoring: Secondary | ICD-10-CM | POA: Diagnosis not present

## 2021-12-08 DIAGNOSIS — I10 Essential (primary) hypertension: Secondary | ICD-10-CM

## 2021-12-08 DIAGNOSIS — G250 Essential tremor: Secondary | ICD-10-CM | POA: Diagnosis not present

## 2021-12-08 MED ORDER — METOPROLOL SUCCINATE ER 50 MG PO TB24: 50.0000 mg | ORAL_TABLET | Freq: Every day | ORAL | 3 refills | Status: DC

## 2021-12-08 MED ORDER — LOSARTAN POTASSIUM 100 MG PO TABS: 100.0000 mg | ORAL_TABLET | Freq: Every day | ORAL | 3 refills | Status: DC
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
## 2022-12-10 ENCOUNTER — Other Ambulatory Visit: Payer: Self-pay | Admitting: Family Medicine

## 2022-12-10 DIAGNOSIS — I10 Essential (primary) hypertension: Secondary | ICD-10-CM

## 2023-02-20 ENCOUNTER — Other Ambulatory Visit: Payer: Self-pay | Admitting: Family Medicine

## 2023-02-20 DIAGNOSIS — I10 Essential (primary) hypertension: Secondary | ICD-10-CM

## 2023-03-14 ENCOUNTER — Other Ambulatory Visit: Payer: Self-pay | Admitting: Family Medicine

## 2023-03-14 DIAGNOSIS — I10 Essential (primary) hypertension: Secondary | ICD-10-CM

## 2023-07-02 ENCOUNTER — Other Ambulatory Visit: Payer: Self-pay | Admitting: Family Medicine

## 2023-07-02 ENCOUNTER — Encounter: Payer: Self-pay | Admitting: Family Medicine

## 2023-07-02 DIAGNOSIS — I10 Essential (primary) hypertension: Secondary | ICD-10-CM

## 2023-07-03 ENCOUNTER — Other Ambulatory Visit: Payer: Self-pay | Admitting: Family Medicine

## 2023-07-03 DIAGNOSIS — I10 Essential (primary) hypertension: Secondary | ICD-10-CM

## 2023-07-03 MED ORDER — METOPROLOL SUCCINATE ER 50 MG PO TB24
50.0000 mg | ORAL_TABLET | Freq: Every day | ORAL | 3 refills | Status: DC
Start: 2023-07-03 — End: 2024-07-12

## 2023-07-03 MED ORDER — HYDROCHLOROTHIAZIDE 12.5 MG PO TABS: 12.5000 mg | ORAL_TABLET | Freq: Every day | ORAL | 3 refills | Status: DC

## 2023-07-03 MED ORDER — LOSARTAN POTASSIUM 100 MG PO TABS: 100.0000 mg | ORAL_TABLET | Freq: Every day | ORAL | 3 refills | Status: DC

## 2023-07-15 ENCOUNTER — Ambulatory Visit (INDEPENDENT_AMBULATORY_CARE_PROVIDER_SITE_OTHER): Payer: Self-pay | Admitting: Family Medicine

## 2023-07-15 VITALS — BP 136/84 | HR 78 | Temp 97.7°F | Ht 65.0 in | Wt 249.6 lb

## 2023-07-15 DIAGNOSIS — F411 Generalized anxiety disorder: Secondary | ICD-10-CM

## 2023-07-15 DIAGNOSIS — B379 Candidiasis, unspecified: Secondary | ICD-10-CM

## 2023-07-15 DIAGNOSIS — R7303 Prediabetes: Secondary | ICD-10-CM

## 2023-07-15 LAB — POCT GLYCOSYLATED HEMOGLOBIN (HGB A1C): Hemoglobin A1C: 6.3 % — AB (ref 4.0–5.6)

## 2023-07-15 MED ORDER — FLUCONAZOLE 150 MG PO TABS
ORAL_TABLET | ORAL | 2 refills | Status: AC
Start: 2023-07-15 — End: ?

## 2023-07-15 NOTE — Progress Notes (Signed)
Established Patient Office Visit   Subjective  Patient ID: Savannah Simon, female    DOB: 24-May-1956  Age: 67 y.o. MRN: [id number]  No chief complaint on file.   Patient is a 67 year old female seen for follow-up.  Patient mentions trying a steroid amount of issues with her insurance.  Was on BCBS state employees plan however did not receive notification that her insurance was switched to Medicare.  Patient states she never picked a Medicare plan and payments for BCBS remain forwarded to [facility].  Patient awaiting new insurance card.  Patient concerned about A1c.  Feels like blood sugar may be increasing.  Endorses numbness in toes.  A1c previously 6.2% on 10/07/2020.  Patient feels she may have a yeast infection given irritation.  Notes tripping over her niece's cat and falling off porch into bushes 4-5 months ago. Caught herself on the R shoulder.  Pt interested in ADHD testing.    Past Medical History:  Diagnosis Date   Anxiety    situational    Hyperlipidemia    on meds-diet controlled   Hypertension    on meds   Tremor    Past Surgical History:  Procedure Laterality Date   DILATION AND CURETTAGE OF UTERUS     x3   LAPAROSCOPIC HYSTERECTOMY N/A 2008   Social History   Tobacco Use   Smoking status: Never   Smokeless tobacco: Never  Vaping Use   Vaping status: Never Used  Substance Use Topics   Alcohol use: Yes    Alcohol/week: 3.0 standard drinks of alcohol    Types: 3 Standard drinks or equivalent per week   Drug use: Never   Family History  Problem Relation Age of Onset   Cancer Mother 65       unknown primary    Diabetes Mother    Hypertension Mother    Miscarriages / [place] Mother    Stroke Mother    Stroke Father    Hypertension Father    Hearing loss Father    Diabetes Father    Arthritis Father    Arthritis Maternal Grandmother    Diabetes Maternal Grandmother    Hearing loss Maternal Grandmother    Hypertension Maternal Grandmother     Alcohol abuse Maternal Grandfather    Arthritis Paternal Grandmother    Heart disease Paternal Grandmother    Heart attack Paternal Grandmother    Hyperlipidemia Paternal Grandmother    Heart disease Paternal Grandfather    Heart attack Paternal Grandfather    Hyperlipidemia Paternal Grandfather    Breast cancer Maternal Aunt    Breast cancer Cousin    Colon polyps Sister 61   Colon cancer Neg Hx    Esophageal cancer Neg Hx    Rectal cancer Neg Hx    Stomach cancer Neg Hx    Allergies  Allergen Reactions   Other Dermatitis    Contact with stainless steal      ROS Negative unless stated above    Objective:     BP 136/84 (BP Location: Left Arm, Patient Position: Sitting, Cuff Size: Large)   Pulse 78   Temp 97.7 F (36.5 C) (Oral)   Ht 5\' 5"  (1.651 m)   Wt 249 lb 9.6 oz (113.2 kg)   SpO2 94%   BMI 41.54 kg/m    Physical Exam Constitutional:      General: She is not in acute distress.    Appearance: Normal appearance.  HENT:     Head: Normocephalic and  atraumatic.     Nose: Nose normal.     Mouth/Throat:     Mouth: Mucous membranes are moist.  Cardiovascular:     Rate and Rhythm: Normal rate and regular rhythm.     Heart sounds: Normal heart sounds. No murmur heard.    No gallop.  Pulmonary:     Effort: Pulmonary effort is normal. No respiratory distress.     Breath sounds: Normal breath sounds. No wheezing, rhonchi or rales.  Skin:    General: Skin is warm and dry.  Neurological:     Mental Status: She is alert and oriented to person, place, and time.      Results for orders placed or performed in visit on 07/15/23  POC HgB A1c  Result Value Ref Range   Hemoglobin A1C 6.3 (A) 4.0 - 5.6 %   HbA1c POC (<> result, manual entry)     HbA1c, POC (prediabetic range)     HbA1c, POC (controlled diabetic range)        07/15/2023   10:10 AM 10/07/2020    2:01 PM  Depression screen PHQ 2/9  Decreased Interest 3 1  Down, Depressed, Hopeless 2 1  PHQ -  2 Score 5 2  Altered sleeping 3 3  Tired, decreased energy 3 2  Change in appetite 2 2  Feeling bad or failure about yourself  3 1  Trouble concentrating 2 1  Moving slowly or fidgety/restless 1 0  Suicidal thoughts  0  PHQ-9 Score 19 11  Difficult doing work/chores  Somewhat difficult      07/15/2023   11:33 AM 10/07/2020    2:02 PM  GAD 7 : Generalized Anxiety Score  Nervous, Anxious, on Edge 3 1  Control/stop worrying 2 1  Worry too much - different things 3 1  Trouble relaxing 1 1  Restless 1 0  Easily annoyed or irritable 2 1  Afraid - awful might happen 3 2  Total GAD 7 Score 15 7  Anxiety Difficulty Very difficult Somewhat difficult        Assessment & Plan:  Prediabetes -Hemoglobin A1c 6.2% on 10/07/2020 -Hemoglobin A1c this visit 6.3% -Discussed the importance of lifestyle modifications including increasing her physical activity and decreasing sugar and carb intake. -     POCT glycosylated hemoglobin (Hb A1C)  Yeast infection -     Fluconazole; Take one tab now.  Repeat dose in 3 days if needed.  Dispense: 2 tablet; Refill: 2  GAD (generalized anxiety disorder) -GAD-7 score 15, PHQ-9 score 19 -Discussed counseling and medication options -Given information for formal ADHD testing as symptoms of anxiety, depression, ADHD can often overlap.   No follow-ups on file.  Patient to follow-up at her convenience for CPE and labs after receiving new insurance card to avoid out-of-pocket expense.  Deeann Saint, MD

## 2023-08-01 ENCOUNTER — Encounter: Payer: Self-pay | Admitting: Family Medicine

## 2023-08-18 ENCOUNTER — Encounter: Payer: Self-pay | Admitting: Family Medicine

## 2024-06-20 ENCOUNTER — Other Ambulatory Visit: Payer: Self-pay | Admitting: Family Medicine

## 2024-06-20 DIAGNOSIS — I10 Essential (primary) hypertension: Secondary | ICD-10-CM

## 2024-07-05 ENCOUNTER — Other Ambulatory Visit: Payer: Self-pay | Admitting: Family Medicine

## 2024-07-05 DIAGNOSIS — I10 Essential (primary) hypertension: Secondary | ICD-10-CM

## 2024-07-10 NOTE — Telephone Encounter (Signed)
 Called patient and sent message in my chart patient needs to sch an Annual

## 2024-07-12 ENCOUNTER — Other Ambulatory Visit: Payer: Self-pay

## 2024-09-30 ENCOUNTER — Other Ambulatory Visit: Payer: Self-pay | Admitting: Family Medicine

## 2024-09-30 DIAGNOSIS — I10 Essential (primary) hypertension: Secondary | ICD-10-CM

## 2024-11-09 ENCOUNTER — Encounter: Payer: Self-pay | Admitting: Family Medicine

## 2024-11-09 DIAGNOSIS — I10 Essential (primary) hypertension: Secondary | ICD-10-CM

## 2024-11-10 MED ORDER — METOPROLOL SUCCINATE ER 50 MG PO TB24: 50.0000 mg | ORAL_TABLET | Freq: Every day | ORAL | 0 refills | Status: DC

## 2024-11-10 MED ORDER — LOSARTAN POTASSIUM 100 MG PO TABS: 100.0000 mg | ORAL_TABLET | Freq: Every day | ORAL | 0 refills | Status: DC

## 2024-11-10 MED ORDER — LISINOPRIL-HYDROCHLOROTHIAZIDE 20-25 MG PO TABS: 1.0000 | ORAL_TABLET | Freq: Every day | ORAL | 0 refills | Status: DC

## 2024-11-13 MED ORDER — HYDROCHLOROTHIAZIDE 12.5 MG PO TABS: 12.5000 mg | ORAL_TABLET | Freq: Every day | ORAL | 0 refills | Status: AC

## 2024-11-13 NOTE — Addendum Note (Signed)
 Addended by: BRIEN SONG A on: 11/13/2024 11:18 AM   Modules accepted: Orders

## 2024-11-29 ENCOUNTER — Ambulatory Visit: Payer: Self-pay | Admitting: Family Medicine

## 2024-11-29 ENCOUNTER — Encounter: Payer: Self-pay | Admitting: Family Medicine

## 2024-11-29 VITALS — BP 118/74 | HR 95 | Temp 98.5°F | Ht 65.0 in | Wt 246.8 lb

## 2024-11-29 DIAGNOSIS — M79601 Pain in right arm: Secondary | ICD-10-CM | POA: Diagnosis not present

## 2024-11-29 DIAGNOSIS — M2141 Flat foot [pes planus] (acquired), right foot: Secondary | ICD-10-CM | POA: Diagnosis not present

## 2024-11-29 DIAGNOSIS — Z13220 Encounter for screening for lipoid disorders: Secondary | ICD-10-CM | POA: Diagnosis not present

## 2024-11-29 DIAGNOSIS — Z0000 Encounter for general adult medical examination without abnormal findings: Secondary | ICD-10-CM | POA: Diagnosis not present

## 2024-11-29 DIAGNOSIS — F419 Anxiety disorder, unspecified: Secondary | ICD-10-CM

## 2024-11-29 DIAGNOSIS — M2142 Flat foot [pes planus] (acquired), left foot: Secondary | ICD-10-CM | POA: Diagnosis not present

## 2024-11-29 DIAGNOSIS — Z780 Asymptomatic menopausal state: Secondary | ICD-10-CM

## 2024-11-29 DIAGNOSIS — R202 Paresthesia of skin: Secondary | ICD-10-CM

## 2024-11-29 DIAGNOSIS — F411 Generalized anxiety disorder: Secondary | ICD-10-CM | POA: Diagnosis not present

## 2024-11-29 DIAGNOSIS — R7303 Prediabetes: Secondary | ICD-10-CM | POA: Diagnosis not present

## 2024-11-29 DIAGNOSIS — M25552 Pain in left hip: Secondary | ICD-10-CM

## 2024-11-29 DIAGNOSIS — I10 Essential (primary) hypertension: Secondary | ICD-10-CM

## 2024-11-29 DIAGNOSIS — Z13820 Encounter for screening for osteoporosis: Secondary | ICD-10-CM

## 2024-11-29 DIAGNOSIS — Z1231 Encounter for screening mammogram for malignant neoplasm of breast: Secondary | ICD-10-CM

## 2024-11-29 LAB — CBC WITH DIFFERENTIAL/PLATELET
Basophils Absolute: 0.1 K/uL (ref 0.0–0.1)
Basophils Relative: 0.7 % (ref 0.0–3.0)
Eosinophils Absolute: 0.1 K/uL (ref 0.0–0.7)
Eosinophils Relative: 1.7 % (ref 0.0–5.0)
HCT: 43.6 % (ref 36.0–46.0)
Hemoglobin: 15 g/dL (ref 12.0–15.0)
Lymphocytes Relative: 25.2 % (ref 12.0–46.0)
Lymphs Abs: 2 K/uL (ref 0.7–4.0)
MCHC: 34.3 g/dL (ref 30.0–36.0)
MCV: 95.8 fl (ref 78.0–100.0)
Monocytes Absolute: 0.3 K/uL (ref 0.1–1.0)
Monocytes Relative: 4.1 % (ref 3.0–12.0)
Neutro Abs: 5.4 K/uL (ref 1.4–7.7)
Neutrophils Relative %: 68.3 % (ref 43.0–77.0)
Platelets: 276 K/uL (ref 150.0–400.0)
RBC: 4.56 Mil/uL (ref 3.87–5.11)
RDW: 12.4 % (ref 11.5–15.5)
WBC: 8 K/uL (ref 4.0–10.5)

## 2024-11-29 LAB — LIPID PANEL
Cholesterol: 230 mg/dL — ABNORMAL HIGH (ref 28–200)
HDL: 45.9 mg/dL
LDL Cholesterol: 137 mg/dL — ABNORMAL HIGH (ref 10–99)
NonHDL: 184.23
Total CHOL/HDL Ratio: 5
Triglycerides: 236 mg/dL — ABNORMAL HIGH (ref 10.0–149.0)
VLDL: 47.2 mg/dL — ABNORMAL HIGH (ref 0.0–40.0)

## 2024-11-29 LAB — COMPREHENSIVE METABOLIC PANEL WITH GFR
ALT: 42 U/L — ABNORMAL HIGH (ref 3–35)
AST: 37 U/L (ref 5–37)
Albumin: 4.6 g/dL (ref 3.5–5.2)
Alkaline Phosphatase: 93 U/L (ref 39–117)
BUN: 19 mg/dL (ref 6–23)
CO2: 29 meq/L (ref 19–32)
Calcium: 10.1 mg/dL (ref 8.4–10.5)
Chloride: 98 meq/L (ref 96–112)
Creatinine, Ser: 0.88 mg/dL (ref 0.40–1.20)
GFR: 67.95 mL/min
Glucose, Bld: 157 mg/dL — ABNORMAL HIGH (ref 70–99)
Potassium: 4.5 meq/L (ref 3.5–5.1)
Sodium: 137 meq/L (ref 135–145)
Total Bilirubin: 0.6 mg/dL (ref 0.2–1.2)
Total Protein: 7.4 g/dL (ref 6.0–8.3)

## 2024-11-29 LAB — VITAMIN D 25 HYDROXY (VIT D DEFICIENCY, FRACTURES): VITD: 52.22 ng/mL (ref 30.00–100.00)

## 2024-11-29 LAB — VITAMIN B12: Vitamin B-12: 1290 pg/mL — ABNORMAL HIGH (ref 211–911)

## 2024-11-29 LAB — T4, FREE: Free T4: 0.96 ng/dL (ref 0.60–1.60)

## 2024-11-29 LAB — TSH: TSH: 3.39 u[IU]/mL (ref 0.35–5.50)

## 2024-11-29 LAB — HEMOGLOBIN A1C: Hgb A1c MFr Bld: 6.6 % — ABNORMAL HIGH (ref 4.6–6.5)

## 2024-11-29 LAB — FOLATE: Folate: >23.7 ng/mL
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
## 2024-12-05 ENCOUNTER — Other Ambulatory Visit (INDEPENDENT_AMBULATORY_CARE_PROVIDER_SITE_OTHER): Payer: Self-pay

## 2024-12-05 ENCOUNTER — Ambulatory Visit: Admitting: Physician Assistant

## 2024-12-05 ENCOUNTER — Other Ambulatory Visit: Payer: Self-pay

## 2024-12-05 ENCOUNTER — Other Ambulatory Visit: Payer: Self-pay | Admitting: Family Medicine

## 2024-12-05 DIAGNOSIS — M25511 Pain in right shoulder: Secondary | ICD-10-CM

## 2024-12-05 DIAGNOSIS — I10 Essential (primary) hypertension: Secondary | ICD-10-CM

## 2024-12-05 DIAGNOSIS — G8929 Other chronic pain: Secondary | ICD-10-CM

## 2024-12-05 DIAGNOSIS — M5442 Lumbago with sciatica, left side: Secondary | ICD-10-CM

## 2024-12-05 MED ORDER — METOPROLOL SUCCINATE ER 50 MG PO TB24: 50.0000 mg | ORAL_TABLET | Freq: Every day | ORAL | 3 refills | Status: AC

## 2024-12-05 MED ORDER — PREDNISONE 10 MG (21) PO TBPK: ORAL_TABLET | ORAL | 0 refills | Status: AC

## 2024-12-05 NOTE — Progress Notes (Signed)
 "  Office Visit Note   Patient: Savannah Simon           Date of Birth: Apr 29, 1956           MRN: [id number] Visit Date: 12/05/2024              Requested by: Mercer Clotilda SAUNDERS, MD [id number] [place],  [place] [address] PCP: Mercer Clotilda SAUNDERS, MD   Assessment & Plan: Visit Diagnoses:  1. Chronic right shoulder pain   2. Chronic bilateral low back pain with left-sided sciatica     Plan: Impression is right shoulder impingement syndrome and chronic low back pain with left lower extremity radiculopathy.  Regards to the shoulder, we have discussed cortisone injection however we will hold off on this for now as her back is causing more discomfort and we have agreed to start her on a steroid for this.  Have also sent in a referral for physical therapy.  If her shoulder pain does not improve, she will follow-up for subacromial cortisone injection.  Otherwise follow-up as needed.  Follow-Up Instructions: Return if symptoms worsen or fail to improve.   Orders:  Orders Placed This Encounter  Procedures   XR Shoulder Right   XR Cervical Spine 2 or 3 views   XR Lumbar Spine 2-3 Views   Ambulatory referral to Physical Therapy   Meds ordered this encounter  Medications   predniSONE  (STERAPRED UNI-PAK 21 TAB) 10 MG (21) TBPK tablet    Sig: Take as directed    Dispense:  21 tablet    Refill:  0      Procedures: No procedures performed   Clinical Data: No additional findings.   Subjective: Chief Complaint  Patient presents with   Right Shoulder - Pain    HPI patient is a pleasant 69 year old female who comes in today with complaints of right shoulder and low back pain.  Right to the shoulder, symptoms have been ongoing for the past 2 years after slipping in her yard and landing on her right outstretched arm.  Majority of her pain is to the proximal deltoid.  Symptoms are described as a constant soreness worse with any lifting above her shoulder where she has associated  weakness.  She also has increased pain and weakness when she is holding her arm up for a long period of time.  She has been taking Tylenol and NSAIDs with little relief.  No previous cortisone injections right shoulder.  In regards to her back, the pain is to the middle of the lower back with radiation down the back of the left leg.  Initial pain began several years ago after doing a lot of moving.  She has been to physical therapy in the past which did seem to help.  The pain she is having now occurs when she is standing as well as when she goes from a seated to standing position.  She notes occasional numbness to the left lower extremity.  No bowel or bladder change or saddle paresthesias.  Review of Systems as detailed in HPI.  All others reviewed and are negative.   Objective: Vital Signs: There were no vitals taken for this visit.  Physical Exam well-developed well-nourished female in no acute distress.  Alert and oriented x 3.  Ortho Exam right shoulder exam: Forward flexion 160 degrees.  Abduction 120 degrees.  Internal rotation to back pocket.  Pain with empty can testing.  Negative speeds and negative O'Brien's.  5 out of  5 strength throughout.  She is neurovascular intact distally.  Lumbar spine exam: Tenderness to the lower lumbar spine.  No paraspinous musculature tenderness.  Increased pain with left sided straight leg raise.  Increased pain with lumbar flexion and extension.  She is neurovascular intact distally.  No focal weakness.  Specialty Comments:  No specialty comments available.  Imaging: XR Shoulder Right Result Date: 12/05/2024 Mild AC arthropathy.  Mild glenohumeral degenerative changes  XR Cervical Spine 2 or 3 views Result Date: 12/05/2024 Advanced multilevel degenerative changes  XR Lumbar Spine 2-3 Views Result Date: 12/05/2024 X-rays demonstrate advanced multilevel degenerative changes with evidence of scoliosis    PMFS History: Patient Active Problem List    Diagnosis Date Noted   Hyperlipidemia 10/27/2019   Atypical chest pain 10/27/2019   Prediabetes 09/30/2019   Essential hypertension 09/28/2019   Benign essential tremor 09/30/2017   GAD (generalized anxiety disorder) 09/30/2017   Hypercalcemia 09/30/2017   Anxiety 07/23/2016   Hyperglycemia 03/27/2015   Intermittent left-sided chest pain 03/27/2015   Gastroesophageal reflux disease without esophagitis 12/24/2014   Obesity 10/12/2013   Allergic rhinitis 03/23/2013   Cough 03/23/2013   Gastro-esophageal reflux disease with esophagitis 03/23/2013   Eustachian tube dysfunction 03/01/2013   Overweight 03/01/2013   Past Medical History:  Diagnosis Date   Anxiety    situational    Hyperlipidemia    on meds-diet controlled   Hypertension    on meds   Tremor     Family History  Problem Relation Age of Onset   Cancer Mother 89       unknown primary    Diabetes Mother    Hypertension Mother    Miscarriages / Stillbirths Mother    Stroke Mother    Stroke Father    Hypertension Father    Hearing loss Father    Diabetes Father    Arthritis Father    Arthritis Maternal Grandmother    Diabetes Maternal Grandmother    Hearing loss Maternal Grandmother    Hypertension Maternal Grandmother    Alcohol abuse Maternal Grandfather    Arthritis Paternal Grandmother    Heart disease Paternal Grandmother    Heart attack Paternal Grandmother    Hyperlipidemia Paternal Grandmother    Heart disease Paternal Grandfather    Heart attack Paternal Grandfather    Hyperlipidemia Paternal Grandfather    Breast cancer Maternal Aunt    Breast cancer Cousin    Colon polyps Sister 15   Colon cancer Neg Hx    Esophageal cancer Neg Hx    Rectal cancer Neg Hx    Stomach cancer Neg Hx     Past Surgical History:  Procedure Laterality Date   DILATION AND CURETTAGE OF UTERUS     x3   LAPAROSCOPIC HYSTERECTOMY N/A 2008   Social History   Occupational History   Not on file  Tobacco Use    Smoking status: Never   Smokeless tobacco: Never  Vaping Use   Vaping status: Never Used  Substance and Sexual Activity   Alcohol use: Yes    Alcohol/week: 3.0 standard drinks of alcohol    Types: 3 Standard drinks or equivalent per week   Drug use: Never   Sexual activity: Not Currently        "

## 2024-12-06 ENCOUNTER — Ambulatory Visit: Payer: Self-pay | Admitting: Family Medicine

## 2024-12-12 ENCOUNTER — Encounter: Payer: Self-pay | Admitting: Physical Therapy

## 2024-12-12 ENCOUNTER — Other Ambulatory Visit: Payer: Self-pay

## 2024-12-12 ENCOUNTER — Ambulatory Visit: Admitting: Physical Therapy

## 2024-12-12 DIAGNOSIS — M5459 Other low back pain: Secondary | ICD-10-CM | POA: Diagnosis not present

## 2024-12-12 DIAGNOSIS — R29898 Other symptoms and signs involving the musculoskeletal system: Secondary | ICD-10-CM

## 2024-12-12 DIAGNOSIS — G8929 Other chronic pain: Secondary | ICD-10-CM

## 2024-12-12 DIAGNOSIS — M6281 Muscle weakness (generalized): Secondary | ICD-10-CM

## 2024-12-12 DIAGNOSIS — R2681 Unsteadiness on feet: Secondary | ICD-10-CM | POA: Diagnosis not present

## 2024-12-12 DIAGNOSIS — M25511 Pain in right shoulder: Secondary | ICD-10-CM | POA: Diagnosis not present

## 2024-12-12 NOTE — Therapy (Signed)
 " OUTPATIENT PHYSICAL THERAPY THORACOLUMBAR EVALUATION   Patient Name: Savannah Simon MRN: [id number] DOB:30-Nov-1955, 69 y.o., female Today's Date: 12/12/2024  END OF SESSION:  PT End of Session - 12/12/24 1152     Visit Number 1    Number of Visits 21    Date for Recertification  02/20/25    Authorization Type [employer]    Authorization Time Period TBD    Progress Note Due on Visit 10    PT Start Time 1104    PT Stop Time 1141    PT Time Calculation (min) 37 min    Activity Tolerance Patient tolerated treatment well    Behavior During Therapy WFL for tasks assessed/performed          Past Medical History:  Diagnosis Date   Anxiety    situational    Hyperlipidemia    on meds-diet controlled   Hypertension    on meds   Tremor    Past Surgical History:  Procedure Laterality Date   DILATION AND CURETTAGE OF UTERUS     x3   LAPAROSCOPIC HYSTERECTOMY N/A 2008   Patient Active Problem List   Diagnosis Date Noted   Hyperlipidemia 10/27/2019   Atypical chest pain 10/27/2019   Prediabetes 09/30/2019   Essential hypertension 09/28/2019   Benign essential tremor 09/30/2017   GAD (generalized anxiety disorder) 09/30/2017   Hypercalcemia 09/30/2017   Anxiety 07/23/2016   Hyperglycemia 03/27/2015   Intermittent left-sided chest pain 03/27/2015   Gastroesophageal reflux disease without esophagitis 12/24/2014   Obesity 10/12/2013   Allergic rhinitis 03/23/2013   Cough 03/23/2013   Gastro-esophageal reflux disease with esophagitis 03/23/2013   Eustachian tube dysfunction 03/01/2013   Overweight 03/01/2013    PCP: Mercer Kirsch MD   REFERRING PROVIDER: Jule Ronal CROME, PA-C  REFERRING DIAG:  Diagnosis  M25.511,G89.29 (ICD-10-CM) - Chronic right shoulder pain  G89.29,M54.42 (ICD-10-CM) - Chronic bilateral low back pain with left-sided sciatica    Rationale for Evaluation and Treatment: Rehabilitation  THERAPY DIAG:  Other low back pain  Chronic right  shoulder pain  Muscle weakness (generalized)  Unsteadiness on feet  Other symptoms and signs involving the musculoskeletal system  ONSET DATE: shoulder about 2 years ago, back started about 10 years ago   SUBJECTIVE:                                                                                                                                                                                           SUBJECTIVE STATEMENT:  The shoulder is from a fall about 2 years ago, the yard had a pretty steep slope, I ended up having a  FOOSH slipping on grass and its not gotten better since then. Also had a second fall a few days after that stepping on a cat and fell into a bush. Back has been progressively getting more stiff and sore- it started right after they moved my room at work at the time. MD sent me to a chiropractor, it felt good during but the next day I went right back to feeling like baseline, also got PT for my back/core which had gotten weak at the time. Went on a trip to [place] by myself last summer, stamina and weakness were an issue for me.   PERTINENT HISTORY:  See above, also per MD:   Objective: Vital Signs: There were no vitals taken for this visit.   Physical Exam well-developed well-nourished female in no acute distress.  Alert and oriented x 3.   Ortho Exam right shoulder exam: Forward flexion 160 degrees.  Abduction 120 degrees.  Internal rotation to back pocket.  Pain with empty can testing.  Negative speeds and negative O'Brien's.  5 out of 5 strength throughout.  She is neurovascular intact distally.  Lumbar spine exam: Tenderness to the lower lumbar spine.  No paraspinous musculature tenderness.  Increased pain with left sided straight leg raise.  Increased pain with lumbar flexion and extension.  She is neurovascular intact distally.  No focal weakness.   Specialty Comments:  No specialty comments available.   Imaging: XR Shoulder Right Result Date: 12/05/2024 Mild AC  arthropathy.  Mild glenohumeral degenerative changes   XR Cervical Spine 2 or 3 views Result Date: 12/05/2024 Advanced multilevel degenerative changes   XR Lumbar Spine 2-3 Views Result Date: 12/05/2024 X-rays demonstrate advanced multilevel degenerative changes with evidence of scoliosis  PAIN:  Are you having pain? Yes: NPRS scale: 2/10 at rest Pain location: R shoulder and low back  Pain description: low grade I'm here  Aggravating factors: over-doing, more stiff in mornings, standing and transitions from sitting  Relieving factors: heat  PRECAUTIONS: None  RED FLAGS: None   WEIGHT BEARING RESTRICTIONS: No  FALLS:  Has patient fallen in last 6 months? No  LIVING ENVIRONMENT: Lives with: lives alone Lives in: House/apartment   OCCUPATION: retired- human resources officer   PLOF: Independent, Independent with basic ADLs, Independent with gait, and Independent with transfers  PATIENT GOALS: regain core, address pain in back/hips to be able to stand/walk longer and return to walking 3-4 miles, strengthen R arm and address pain   NEXT MD VISIT: PRN with Referring   OBJECTIVE:  Note: Objective measures were completed at Evaluation unless otherwise noted.  DIAGNOSTIC FINDINGS:  As above in PMH  PATIENT SURVEYS:  PSFS: THE PATIENT SPECIFIC FUNCTIONAL SCALE  Place score of 0-10 (0 = unable to perform activity and 10 = able to perform activity at the same level as before injury or problem)  Activity Date: 12/12/24 eval     Walking/standing comfortably  3    2.Sit to stand transitions  4    3. Using R arm for ADLs/tasks overhead/functional tasks at home  3    4.      Total Score 3.3      Total Score = Sum of activity scores/number of activities  Minimally Detectable Change: 3 points (for single activity); 2 points (for average score)  [facility] (nd). The Patient Specific Functional Scale . Retrieved from  [email or web address]   COGNITION: Overall cognitive status: Within functional limits for tasks assessed     SENSATION: Not  tested    POSTURE: rounded shoulders and forward head    LUMBAR ROM:   AROM eval  Flexion 25% limited   Extension WNL but unsteady   Right lateral flexion About 40% limited, unsteady   Left lateral flexion About 40% limited, unsteady   Right rotation   Left rotation    (Blank rows = not tested)   12/12/24- shoulder AROM: flexion and ABD AROM  WNL but both caused anterior R shoulder pain, FIR L WNL R to greater trochanter; FER L T4, R to upper trap level    LOWER EXTREMITY MMT:    MMT Right eval Left eval  Hip flexion 4+ 4+  Hip extension    Hip abduction    Hip adduction    Hip internal rotation    Hip external rotation    Knee flexion 4+ 4+  Knee extension 5 4+  Ankle dorsiflexion    Ankle plantarflexion    Ankle inversion    Ankle eversion     (Blank rows = not tested)  12/12/24- shoulder MMT: flexion 4+/5 B no pain, ABD L 4+/5, R 4-/5 anterior pain, ER and IR 4/5 B, biceps 4+/5  FUNCTIONAL TESTS:   Berg TBD (no time at eval)   TREATMENT DATE:   12/12/24  Eval, POC                                                                                                                                  PATIENT EDUCATION:  Education details: exam findings, POC, HEP  Person educated: Patient Education method: Explanation and Verbal cues Education comprehension: verbalized understanding, returned demonstration, and needs further education  HOME EXERCISE PROGRAM:  TBD  ASSESSMENT:  CLINICAL IMPRESSION: Patient is a 69 y.o. F who was seen today for physical therapy evaluation and treatment for  Diagnosis  M25.511,G89.29 (ICD-10-CM) - Chronic right shoulder pain  G89.29,M54.42 (ICD-10-CM) - Chronic bilateral low back pain with left-sided sciatica  . Objectives as above. Very pleasant  but very talkative, which limited eval today. Will make every effort to address her functional concerns and pain/level of function.   OBJECTIVE IMPAIRMENTS: decreased activity tolerance, decreased balance, difficulty walking, decreased ROM, decreased strength, postural dysfunction, obesity, and pain.   ACTIVITY LIMITATIONS: carrying, lifting, standing, squatting, transfers, reach over head, hygiene/grooming, and locomotion level  PARTICIPATION LIMITATIONS: meal prep, cleaning, laundry, driving, shopping, community activity, and yard work  PERSONAL FACTORS: Age, Behavior pattern, Fitness, Past/current experiences, and Time since onset of injury/illness/exacerbation are also affecting patient's functional outcome.   REHAB POTENTIAL: Fair chronicity of pain   CLINICAL DECISION MAKING: Stable/uncomplicated  EVALUATION COMPLEXITY: Low   GOALS: Goals reviewed with patient? No  SHORT TERM GOALS: Target date: 01/16/2025      Will be independent with appropriate progressive HEP  Baseline: Goal status: INITIAL  2.  Sx to have improved by at least 25% since having started PT  Baseline:  Goal status:  INITIAL  3. Will score at least 48 on Berg  Goal status INITIAL      LONG TERM GOALS: Target date: 02/20/2025    MMT to have improved by at least 1 grade in all weak groups  Baseline:  Goal status: INITIAL  2.  Will be able to use R UE for all desired functional tasks and hobbies with pain no more than 2/10 Baseline:  Goal status: INITIAL  3.  Will have been able to return to regular walking program with hip/back pain no more than 2/10 Baseline:  Goal status: INITIAL  4.  Sx/complaints to have improved by at least 75% Baseline:  Goal status: INITIAL  5.  PSFS to have improved by at least 2 points  Baseline:  Goal status: INITIAL  6.  Will be able to perform all self care and home care tasks, including OH reaching with R shoulder pain no more than 2/10 Baseline:  Goal  status: INITIAL  PLAN:  PT FREQUENCY: 2x/week  PT DURATION: 10 weeks  PLANNED INTERVENTIONS: 97164- PT Re-evaluation, 97750- Physical Performance Testing, 97110-Therapeutic exercises, 97530- Therapeutic activity, [id number]- Neuromuscular re-education, 97535- Self Care, and [id number]- Manual therapy.  PLAN FOR NEXT SESSION: Needs HEP and initial berg score, work on lumbar and hip ROM/core strength and postural training, manual PRN, scapular mm retraining/strengthening   Josette Rough, PT, DPT 12/12/24 11:53 AM   Referring diagnosis?  Diagnosis  M25.511,G89.29 (ICD-10-CM) - Chronic right shoulder pain  G89.29,M54.42 (ICD-10-CM) - Chronic bilateral low back pain with left-sided sciatica   Treatment diagnosis? (if different than referring diagnosis) M54.59, M25.511, G89.29, M62.81, R26.81, R29.898  What was this (referring dx) caused by? []  Surgery []  Fall [x]  Ongoing issue []  Arthritis []  Other: ____________  Laterality: []  Rt []  Lt [x]  Both- R shoulder, B lumbar     Check all possible CPT codes:  *CHOOSE 10 OR LESS*    See Planned Interventions listed in the Plan section of the Evaluation.   "

## 2024-12-13 ENCOUNTER — Encounter: Payer: Self-pay | Admitting: Family Medicine

## 2024-12-13 ENCOUNTER — Ambulatory Visit: Admitting: Family Medicine

## 2024-12-13 VITALS — BP 130/80 | HR 72 | Temp 97.9°F | Ht 65.0 in | Wt 249.2 lb

## 2024-12-13 DIAGNOSIS — I10 Essential (primary) hypertension: Secondary | ICD-10-CM | POA: Diagnosis not present

## 2024-12-13 DIAGNOSIS — R7303 Prediabetes: Secondary | ICD-10-CM

## 2024-12-13 DIAGNOSIS — E119 Type 2 diabetes mellitus without complications: Secondary | ICD-10-CM | POA: Diagnosis not present

## 2024-12-13 DIAGNOSIS — F411 Generalized anxiety disorder: Secondary | ICD-10-CM

## 2024-12-13 DIAGNOSIS — Z23 Encounter for immunization: Secondary | ICD-10-CM

## 2024-12-13 MED ORDER — LANCETS MISC: 1.0000 | 1 refills | Status: AC

## 2024-12-13 MED ORDER — LOSARTAN POTASSIUM 100 MG PO TABS: 100.0000 mg | ORAL_TABLET | Freq: Every day | ORAL | 3 refills | Status: AC

## 2024-12-13 MED ORDER — BLOOD GLUCOSE MONITORING SUPPL DEVI: 1.0000 | 0 refills | Status: AC

## 2024-12-13 MED ORDER — BLOOD GLUCOSE TEST VI STRP: 1.0000 | ORAL_STRIP | 1 refills | Status: AC

## 2024-12-13 MED ORDER — LANCET DEVICE MISC: 1.0000 | 0 refills | Status: AC

## 2024-12-13 NOTE — Progress Notes (Signed)
 "  Established Patient Office Visit   Subjective  Patient ID: Savannah Simon, female    DOB: 12/13/1955  Age: 69 y.o. MRN: [id number]  Chief Complaint  Patient presents with   Medical Management of Chronic Issues    Two week follow-up     Pt is a 69 yo female seen for f/u after CPE and for ongoing concerns.  Recent A1C elevated at 6.6%. Making diet changes, tracking food intake, and reducing carbohydrate consumption.  She has noticed a recent weight increase, which she attributes to dietary choices and water intake. She consumes comfort foods, such as fried bologna sandwiches, and recognizes the need to adjust her diet to better manage her blood sugar levels.  Pt endorses back and shoulder pain, which has limited her physical activity. Started physical therapy yesterday. She describes herself as 'unsteady' due to flat feet, a lifelong issue contributing to clumsiness.   Pt believes she had the flu around Thanksgiving, symptoms lasted 2 weeks.  Although she did not get tested, she believes it was the flu based on symptoms and exposure to others in the choir at church who tested positive. She is not opposed to the flu shot but questions its necessity.  She is interested in glucose monitoring, mentioning a past incident where she encountered someone with a glucose alarm. She wants to track her blood sugar levels to better understand her condition.    Patient Active Problem List   Diagnosis Date Noted   Hyperlipidemia 10/27/2019   Atypical chest pain 10/27/2019   Prediabetes 09/30/2019   Essential hypertension 09/28/2019   Benign essential tremor 09/30/2017   GAD (generalized anxiety disorder) 09/30/2017   Hypercalcemia 09/30/2017   Anxiety 07/23/2016   Hyperglycemia 03/27/2015   Intermittent left-sided chest pain 03/27/2015   Gastroesophageal reflux disease without esophagitis 12/24/2014   Obesity 10/12/2013   Allergic rhinitis 03/23/2013   Cough 03/23/2013   Gastro-esophageal  reflux disease with esophagitis 03/23/2013   Eustachian tube dysfunction 03/01/2013   Overweight 03/01/2013   Past Medical History:  Diagnosis Date   Anxiety    situational    Hyperlipidemia    on meds-diet controlled   Hypertension    on meds   Tremor    Past Surgical History:  Procedure Laterality Date   DILATION AND CURETTAGE OF UTERUS     x3   LAPAROSCOPIC HYSTERECTOMY N/A 2008   Social History[1] Family History  Problem Relation Age of Onset   Cancer Mother 63       unknown primary    Diabetes Mother    Hypertension Mother    Miscarriages / Stillbirths Mother    Stroke Mother    Stroke Father    Hypertension Father    Hearing loss Father    Diabetes Father    Arthritis Father    Arthritis Maternal Grandmother    Diabetes Maternal Grandmother    Hearing loss Maternal Grandmother    Hypertension Maternal Grandmother    Alcohol abuse Maternal Grandfather    Arthritis Paternal Grandmother    Heart disease Paternal Grandmother    Heart attack Paternal Grandmother    Hyperlipidemia Paternal Grandmother    Heart disease Paternal Grandfather    Heart attack Paternal Grandfather    Hyperlipidemia Paternal Grandfather    Breast cancer Maternal Aunt    Breast cancer Cousin    Colon polyps Sister 34   Colon cancer Neg Hx    Esophageal cancer Neg Hx    Rectal cancer Neg Hx  Stomach cancer Neg Hx    Allergies[2]  ROS Negative unless stated above    Objective:     BP 130/80   Pulse 72   Temp 97.9 F (36.6 C)   Ht 5' 5 (1.651 m)   Wt 249 lb 3.2 oz (113 kg)   SpO2 97%   BMI 41.47 kg/m  BP Readings from Last 3 Encounters:  12/13/24 130/80  11/29/24 118/74  07/15/23 136/84   Wt Readings from Last 3 Encounters:  12/13/24 249 lb 3.2 oz (113 kg)  11/29/24 246 lb 12.8 oz (111.9 kg)  07/15/23 249 lb 9.6 oz (113.2 kg)      Physical Exam Constitutional:      General: She is not in acute distress.    Appearance: Normal appearance.  HENT:     Head:  Normocephalic and atraumatic.     Nose: Nose normal.     Mouth/Throat:     Mouth: Mucous membranes are moist.  Cardiovascular:     Rate and Rhythm: Normal rate and regular rhythm.     Heart sounds: Normal heart sounds. No murmur heard.    No gallop.  Pulmonary:     Effort: Pulmonary effort is normal. No respiratory distress.     Breath sounds: Normal breath sounds. No wheezing, rhonchi or rales.  Skin:    General: Skin is warm and dry.  Neurological:     Mental Status: She is alert and oriented to person, place, and time.        11/29/2024   11:33 AM 07/15/2023   10:10 AM 10/07/2020    2:01 PM  Depression screen PHQ 2/9  Decreased Interest 2 3 1   Down, Depressed, Hopeless 2 2 1   PHQ - 2 Score 4 5 2   Altered sleeping 2 3 3   Tired, decreased energy 3 3 2   Change in appetite 2 2 2   Feeling bad or failure about yourself  1 3 1   Trouble concentrating 2 2 1   Moving slowly or fidgety/restless 2 1 0  Suicidal thoughts 0  0  PHQ-9 Score 16 19  11    Difficult doing work/chores Very difficult  Somewhat difficult     Data saved with a previous flowsheet row definition      11/29/2024   11:33 AM 07/15/2023   11:33 AM 10/07/2020    2:02 PM  GAD 7 : Generalized Anxiety Score  Nervous, Anxious, on Edge 2  3  1    Control/stop worrying 2  2  1    Worry too much - different things 2  3  1    Trouble relaxing 1  1  1    Restless 0  1  0   Easily annoyed or irritable 1  2  1    Afraid - awful might happen 1  3  2    Total GAD 7 Score 9 15 7   Anxiety Difficulty Very difficult Very difficult Somewhat difficult     Data saved with a previous flowsheet row definition     No results found for any visits on 12/13/24.    Assessment & Plan:   Diet-controlled diabetes mellitus (HCC) -     Blood Glucose Monitoring Suppl; 1 each by Does not apply route as directed. Dispense based on patient and insurance preference. Use up to four times daily as directed. (FOR ICD-10 E10.9, E11.9).  Dispense: 1  each; Refill: 0 -     Blood Glucose Test; 1 each by Does not apply route as directed. Dispense based  on patient and insurance preference. Use up to four times daily as directed. (FOR ICD-10 E10.9, E11.9).  Dispense: 100 strip; Refill: 1 -     Lancet Device; 1 each by Does not apply route as directed. Dispense based on patient and insurance preference. Use up to four times daily as directed. (FOR ICD-10 E10.9, E11.9).  Dispense: 1 each; Refill: 0 -     Lancets; 1 each by Does not apply route as directed. Dispense based on patient and insurance preference. Use up to four times daily as directed. (FOR ICD-10 E10.9, E11.9).  Dispense: 100 each; Refill: 1  GAD (generalized anxiety disorder)  Essential hypertension -     Losartan  Potassium; Take 1 tablet (100 mg total) by mouth daily.  Dispense: 90 tablet; Refill: 3  Need for vaccination against Streptococcus pneumoniae -     Pneumococcal conjugate vaccine 20-valent  Newly dx'd DM.  Hgb A1C 6.6%.  Rx for cgm and glucometer.  Sample cgm given in clinic.  Will continue lifestyle modifications.  F/u in 3 mos.  If A1c continues to rise start medication.  PHQ 9 score 16 and GAD 7 score 9 at last OFV 2 wks ago. Discussed self care.  Consider counseling.  HTN elevated this visit.  Recheck. Monitor at home, keep log to bring to clinic.   Continue losartan  100 mg, toprol  xl 50 mg, and hydrochlorothiazide  12.5 mg daily. PNA vaccine given.   Return in about 3 months (around 03/13/2025).   Clotilda JONELLE Single, MD      [1]  Social History Tobacco Use   Smoking status: Never   Smokeless tobacco: Never  Vaping Use   Vaping status: Never Used  Substance Use Topics   Alcohol use: Yes    Alcohol/week: 3.0 standard drinks of alcohol    Types: 3 Standard drinks or equivalent per week   Drug use: Never  [2]  Allergies Allergen Reactions   Other Dermatitis    Contact with stainless steal   "

## 2024-12-13 NOTE — Therapy (Signed)
 " OUTPATIENT PHYSICAL THERAPY THORACOLUMBAR TREATMENT   Patient Name: Savannah Simon MRN: [id number] DOB:December 18, 1955, 69 y.o., female Today's Date: 12/14/2024  END OF SESSION:  PT End of Session - 12/14/24 1138     Visit Number 2    Number of Visits 21    Date for Recertification  02/20/25    Authorization Type [employer]    PT Start Time 0930    PT Stop Time 1010    PT Time Calculation (min) 40 min    Activity Tolerance Patient tolerated treatment well    Behavior During Therapy WFL for tasks assessed/performed           Past Medical History:  Diagnosis Date   Anxiety    situational    Hyperlipidemia    on meds-diet controlled   Hypertension    on meds   Tremor    Past Surgical History:  Procedure Laterality Date   DILATION AND CURETTAGE OF UTERUS     x3   LAPAROSCOPIC HYSTERECTOMY N/A 2008   Patient Active Problem List   Diagnosis Date Noted   Hyperlipidemia 10/27/2019   Atypical chest pain 10/27/2019   Prediabetes 09/30/2019   Essential hypertension 09/28/2019   Benign essential tremor 09/30/2017   GAD (generalized anxiety disorder) 09/30/2017   Hypercalcemia 09/30/2017   Anxiety 07/23/2016   Hyperglycemia 03/27/2015   Intermittent left-sided chest pain 03/27/2015   Gastroesophageal reflux disease without esophagitis 12/24/2014   Obesity 10/12/2013   Allergic rhinitis 03/23/2013   Cough 03/23/2013   Gastro-esophageal reflux disease with esophagitis 03/23/2013   Eustachian tube dysfunction 03/01/2013   Overweight 03/01/2013    PCP: Mercer Kirsch MD   REFERRING PROVIDER: Jule Ronal CROME, PA-C  REFERRING DIAG:  Diagnosis  M25.511,G89.29 (ICD-10-CM) - Chronic right shoulder pain  G89.29,M54.42 (ICD-10-CM) - Chronic bilateral low back pain with left-sided sciatica    Rationale for Evaluation and Treatment: Rehabilitation  THERAPY DIAG:  Other low back pain  Chronic right shoulder pain  Muscle weakness (generalized)  Unsteadiness on  feet  Other symptoms and signs involving the musculoskeletal system  ONSET DATE: shoulder about 2 years ago, back started about 10 years ago   SUBJECTIVE:                                                                                                                                                                                           SUBJECTIVE STATEMENT: Pt reports she wants to focus on shoulder, core strength and balance.  PERTINENT HISTORY:  See above, also per MD:   Objective: Vital Signs: There were no vitals taken for this visit.   Physical Exam  well-developed well-nourished female in no acute distress.  Alert and oriented x 3.   Ortho Exam right shoulder exam: Forward flexion 160 degrees.  Abduction 120 degrees.  Internal rotation to back pocket.  Pain with empty can testing.  Negative speeds and negative O'Brien's.  5 out of 5 strength throughout.  She is neurovascular intact distally.  Lumbar spine exam: Tenderness to the lower lumbar spine.  No paraspinous musculature tenderness.  Increased pain with left sided straight leg raise.  Increased pain with lumbar flexion and extension.  She is neurovascular intact distally.  No focal weakness.   Specialty Comments:  No specialty comments available.   Imaging: XR Shoulder Right Result Date: 12/05/2024 Mild AC arthropathy.  Mild glenohumeral degenerative changes   XR Cervical Spine 2 or 3 views Result Date: 12/05/2024 Advanced multilevel degenerative changes   XR Lumbar Spine 2-3 Views Result Date: 12/05/2024 X-rays demonstrate advanced multilevel degenerative changes with evidence of scoliosis  PAIN:  Are you having pain? Yes: NPRS scale: 2/10 at rest Pain location: R shoulder and low back  Pain description: low grade I'm here  Aggravating factors: over-doing, more stiff in mornings, standing and transitions from sitting  Relieving factors: heat  PRECAUTIONS: None  RED FLAGS: None   WEIGHT BEARING  RESTRICTIONS: No  FALLS:  Has patient fallen in last 6 months? No  LIVING ENVIRONMENT: Lives with: lives alone Lives in: House/apartment   OCCUPATION: retired- human resources officer   PLOF: Independent, Independent with basic ADLs, Independent with gait, and Independent with transfers  PATIENT GOALS: regain core, address pain in back/hips to be able to stand/walk longer and return to walking 3-4 miles, strengthen R arm and address pain   NEXT MD VISIT: PRN with Referring   OBJECTIVE:  Note: Objective measures were completed at Evaluation unless otherwise noted.  DIAGNOSTIC FINDINGS:  As above in PMH  PATIENT SURVEYS:  PSFS: THE PATIENT SPECIFIC FUNCTIONAL SCALE  Place score of 0-10 (0 = unable to perform activity and 10 = able to perform activity at the same level as before injury or problem)  Activity Date: 12/12/24 eval     Walking/standing comfortably  3    2.Sit to stand transitions  4    3. Using R arm for ADLs/tasks overhead/functional tasks at home  3    4.      Total Score 3.3      Total Score = Sum of activity scores/number of activities  Minimally Detectable Change: 3 points (for single activity); 2 points (for average score)  [facility] (nd). The Patient Specific Functional Scale . Retrieved from [email or web address]   COGNITION: Overall cognitive status: Within functional limits for tasks assessed     SENSATION: Not tested    POSTURE: rounded shoulders and forward head    LUMBAR ROM:   AROM eval  Flexion 25% limited   Extension WNL but unsteady   Right lateral flexion About 40% limited, unsteady   Left lateral flexion About 40% limited, unsteady   Right rotation   Left rotation    (Blank rows = not tested)   12/12/24- shoulder AROM: flexion and ABD AROM  WNL but both caused anterior R shoulder pain, FIR L WNL R to greater trochanter; FER L T4, R to upper trap level    LOWER  EXTREMITY MMT:    MMT Right eval Left eval  Hip flexion 4+ 4+  Hip extension    Hip abduction    Hip adduction    Hip  internal rotation    Hip external rotation    Knee flexion 4+ 4+  Knee extension 5 4+  Ankle dorsiflexion    Ankle plantarflexion    Ankle inversion    Ankle eversion     (Blank rows = not tested)  12/12/24- shoulder MMT: flexion 4+/5 B no pain, ABD L 4+/5, R 4-/5 anterior pain, ER and IR 4/5 B, biceps 4+/5  FUNCTIONAL TESTS:   Berg TBD (no time at eval)   TREATMENT DATE:  12/14/24  Access Code: [email or web address] URL: [email or web address] Date: 12/14/2024 Prepared by: Burnard Meth  Exercises - Seated Shoulder Shrugs  - 2 x daily - 7 x weekly - 2 sets - 10 reps - Seated Shoulder Row with Anchored Resistance  - 2 x daily - 7 x weekly - 2 sets - 10 reps - 2 hold - Supine Posterior Pelvic Tilt  - 2 x daily - 7 x weekly - 2 sets - 10 reps - 3 hold - Supine Bridge  - 2 x daily - 7 x weekly - 2 sets - 10 reps - Supine March  - 2 x daily - 7 x weekly - 2 sets - 10 reps - Mini Squat with Counter Support  - 2 x daily - 7 x weekly - 2 sets - 10 reps - Seated Long Arc Quad  - 2 x daily - 7 x weekly - 2 sets - 10 reps - Hooklying Hamstring Stretch with Strap  - 2 x daily - 7 x weekly - 1 sets - 3 reps - 25 hold   12/12/24  Eval, POC                                                                                                                                  PATIENT EDUCATION:  Education details: exam findings, POC, HEP  Person educated: Patient Education method: Explanation and Verbal cues Education comprehension: verbalized understanding, returned demonstration, and needs further education  HOME EXERCISE PROGRAM:  TBD  ASSESSMENT:  CLINICAL IMPRESSION: Patient  needed VC and tactile cues for all HEP instruction.  Demonstrated understanding. Handout of HEP dispensed.  OBJECTIVE IMPAIRMENTS: decreased activity tolerance, decreased balance,  difficulty walking, decreased ROM, decreased strength, postural dysfunction, obesity, and pain.   ACTIVITY LIMITATIONS: carrying, lifting, standing, squatting, transfers, reach over head, hygiene/grooming, and locomotion level  PARTICIPATION LIMITATIONS: meal prep, cleaning, laundry, driving, shopping, community activity, and yard work  PERSONAL FACTORS: Age, Behavior pattern, Fitness, Past/current experiences, and Time since onset of injury/illness/exacerbation are also affecting patient's functional outcome.   REHAB POTENTIAL: Fair chronicity of pain   CLINICAL DECISION MAKING: Stable/uncomplicated  EVALUATION COMPLEXITY: Low   GOALS: Goals reviewed with patient? No  SHORT TERM GOALS: Target date: 01/16/2025      Will be independent with appropriate progressive HEP  Baseline: Goal status: INITIAL  2.  Sx to have improved by at least 25% since having started PT  Baseline:  Goal status: INITIAL  3. Will score at least 48 on Berg  Goal status INITIAL      LONG TERM GOALS: Target date: 02/20/2025    MMT to have improved by at least 1 grade in all weak groups  Baseline:  Goal status: INITIAL  2.  Will be able to use R UE for all desired functional tasks and hobbies with pain no more than 2/10 Baseline:  Goal status: INITIAL  3.  Will have been able to return to regular walking program with hip/back pain no more than 2/10 Baseline:  Goal status: INITIAL  4.  Sx/complaints to have improved by at least 75% Baseline:  Goal status: INITIAL  5.  PSFS to have improved by at least 2 points  Baseline:  Goal status: INITIAL  6.  Will be able to perform all self care and home care tasks, including OH reaching with R shoulder pain no more than 2/10 Baseline:  Goal status: INITIAL  PLAN:  PT FREQUENCY: 2x/week  PT DURATION: 10 weeks  PLANNED INTERVENTIONS: 97164- PT Re-evaluation, 97750- Physical Performance Testing, 97110-Therapeutic exercises, 97530- Therapeutic  activity, [id number]- Neuromuscular re-education, 97535- Self Care, and 02859- Manual therapy.  PLAN FOR NEXT SESSION: Needs initial berg score, work on lumbar and hip ROM/core strength and postural training, manual PRN, scapular mm retraining/strengthening   Burnard Meth, PT 12/14/24  11:40 AM     Referring diagnosis?  Diagnosis  M25.511,G89.29 (ICD-10-CM) - Chronic right shoulder pain  G89.29,M54.42 (ICD-10-CM) - Chronic bilateral low back pain with left-sided sciatica   Treatment diagnosis? (if different than referring diagnosis) M54.59, M25.511, G89.29, M62.81, R26.81, R29.898  What was this (referring dx) caused by? []  Surgery []  Fall [x]  Ongoing issue []  Arthritis []  Other: ____________  Laterality: []  Rt []  Lt [x]  Both- R shoulder, B lumbar     Check all possible CPT codes:  *CHOOSE 10 OR LESS*    See Planned Interventions listed in the Plan section of the Evaluation.   "

## 2024-12-14 ENCOUNTER — Ambulatory Visit

## 2024-12-14 DIAGNOSIS — M25511 Pain in right shoulder: Secondary | ICD-10-CM | POA: Diagnosis not present

## 2024-12-14 DIAGNOSIS — G8929 Other chronic pain: Secondary | ICD-10-CM

## 2024-12-14 DIAGNOSIS — R29898 Other symptoms and signs involving the musculoskeletal system: Secondary | ICD-10-CM

## 2024-12-14 DIAGNOSIS — R2681 Unsteadiness on feet: Secondary | ICD-10-CM | POA: Diagnosis not present

## 2024-12-14 DIAGNOSIS — M6281 Muscle weakness (generalized): Secondary | ICD-10-CM | POA: Diagnosis not present

## 2024-12-14 DIAGNOSIS — M5459 Other low back pain: Secondary | ICD-10-CM

## 2024-12-18 ENCOUNTER — Encounter

## 2024-12-22 ENCOUNTER — Encounter: Payer: Self-pay | Admitting: Rehabilitative and Restorative Service Providers"

## 2024-12-22 ENCOUNTER — Ambulatory Visit: Admitting: Rehabilitative and Restorative Service Providers"

## 2024-12-22 DIAGNOSIS — R29898 Other symptoms and signs involving the musculoskeletal system: Secondary | ICD-10-CM

## 2024-12-22 DIAGNOSIS — G8929 Other chronic pain: Secondary | ICD-10-CM | POA: Diagnosis not present

## 2024-12-22 DIAGNOSIS — M6281 Muscle weakness (generalized): Secondary | ICD-10-CM

## 2024-12-22 DIAGNOSIS — M5459 Other low back pain: Secondary | ICD-10-CM

## 2024-12-22 DIAGNOSIS — R2681 Unsteadiness on feet: Secondary | ICD-10-CM | POA: Diagnosis not present

## 2024-12-22 DIAGNOSIS — M25511 Pain in right shoulder: Secondary | ICD-10-CM

## 2024-12-22 NOTE — Therapy (Signed)
 " OUTPATIENT PHYSICAL THERAPY THORACOLUMBAR TREATMENT   Patient Name: Savannah Simon MRN: [id number] DOB:24-Apr-1956, 69 y.o., female Today's Date: 12/22/2024  END OF SESSION:  PT End of Session - 12/22/24 1136     Visit Number 3    Number of Visits 21    Date for Recertification  02/20/25    Authorization Type [employer]    PT Start Time 1136    PT Stop Time 1231    PT Time Calculation (min) 55 min    Activity Tolerance Patient tolerated treatment well;No increased pain    Behavior During Therapy WFL for tasks assessed/performed          Past Medical History:  Diagnosis Date   Anxiety    situational    Hyperlipidemia    on meds-diet controlled   Hypertension    on meds   Tremor    Past Surgical History:  Procedure Laterality Date   DILATION AND CURETTAGE OF UTERUS     x3   LAPAROSCOPIC HYSTERECTOMY N/A 2008   Patient Active Problem List   Diagnosis Date Noted   Hyperlipidemia 10/27/2019   Atypical chest pain 10/27/2019   Prediabetes 09/30/2019   Essential hypertension 09/28/2019   Benign essential tremor 09/30/2017   GAD (generalized anxiety disorder) 09/30/2017   Hypercalcemia 09/30/2017   Anxiety 07/23/2016   Hyperglycemia 03/27/2015   Intermittent left-sided chest pain 03/27/2015   Gastroesophageal reflux disease without esophagitis 12/24/2014   Obesity 10/12/2013   Allergic rhinitis 03/23/2013   Cough 03/23/2013   Gastro-esophageal reflux disease with esophagitis 03/23/2013   Eustachian tube dysfunction 03/01/2013   Overweight 03/01/2013    PCP: Mercer Kirsch MD   REFERRING PROVIDER: Jule Ronal CROME, PA-C  REFERRING DIAG:  Diagnosis  M25.511,G89.29 (ICD-10-CM) - Chronic right shoulder pain  G89.29,M54.42 (ICD-10-CM) - Chronic bilateral low back pain with left-sided sciatica    Rationale for Evaluation and Treatment: Rehabilitation  THERAPY DIAG:  Other low back pain  Chronic right shoulder pain  Muscle weakness  (generalized)  Unsteadiness on feet  Other symptoms and signs involving the musculoskeletal system  ONSET DATE: shoulder about 2 years ago, back started about 10 years ago   SUBJECTIVE:                                                                                                                                                                                           SUBJECTIVE STATEMENT: Aritza wants to focus on the back, right shoulder and knees.  She wants to be able to return to hiking.  She mentions not being able to attach her bra behind her back.  PERTINENT HISTORY:  See above, also per MD:   Objective: Vital Signs: There were no vitals taken for this visit.   Physical Exam well-developed well-nourished female in no acute distress.  Alert and oriented x 3.   Ortho Exam right shoulder exam: Forward flexion 160 degrees.  Abduction 120 degrees.  Internal rotation to back pocket.  Pain with empty can testing.  Negative speeds and negative O'Brien's.  5 out of 5 strength throughout.  She is neurovascular intact distally.  Lumbar spine exam: Tenderness to the lower lumbar spine.  No paraspinous musculature tenderness.  Increased pain with left sided straight leg raise.  Increased pain with lumbar flexion and extension.  She is neurovascular intact distally.  No focal weakness.   Specialty Comments:  No specialty comments available.   Imaging: XR Shoulder Right Result Date: 12/05/2024 Mild AC arthropathy.  Mild glenohumeral degenerative changes   XR Cervical Spine 2 or 3 views Result Date: 12/05/2024 Advanced multilevel degenerative changes   XR Lumbar Spine 2-3 Views Result Date: 12/05/2024 X-rays demonstrate advanced multilevel degenerative changes with evidence of scoliosis  PAIN:  Are you having pain? Yes: NPRS scale: 0-4/10 this week Pain location: R shoulder, knees and low back  Pain description: low grade I'm here  Aggravating factors: over-doing, more stiff in  mornings, standing and transitions from sitting  Relieving factors: heat  PRECAUTIONS: None  RED FLAGS: None   WEIGHT BEARING RESTRICTIONS: No  FALLS:  Has patient fallen in last 6 months? No  LIVING ENVIRONMENT: Lives with: lives alone Lives in: House/apartment   OCCUPATION: retired- human resources officer   PLOF: Independent, Independent with basic ADLs, Independent with gait, and Independent with transfers  PATIENT GOALS: regain core, address pain in back/hips to be able to stand/walk longer and return to walking 3-4 miles, strengthen R arm and address pain   NEXT MD VISIT: PRN with Referring   OBJECTIVE:  Note: Objective measures were completed at Evaluation unless otherwise noted.  DIAGNOSTIC FINDINGS:  As above in PMH  PATIENT SURVEYS:  PSFS: THE PATIENT SPECIFIC FUNCTIONAL SCALE  Place score of 0-10 (0 = unable to perform activity and 10 = able to perform activity at the same level as before injury or problem)  Activity Date: 12/12/24 eval     Walking/standing comfortably  3    2.Sit to stand transitions  4    3. Using R arm for ADLs/tasks overhead/functional tasks at home  3    4.      Total Score 3.3      Total Score = Sum of activity scores/number of activities  Minimally Detectable Change: 3 points (for single activity); 2 points (for average score)  [facility] (nd). The Patient Specific Functional Scale . Retrieved from [email or web address]   COGNITION: Overall cognitive status: Within functional limits for tasks assessed     SENSATION: Not tested    POSTURE: rounded shoulders and forward head    LUMBAR ROM:   AROM eval  Flexion 25% limited   Extension WNL but unsteady   Right lateral flexion About 40% limited, unsteady   Left lateral flexion About 40% limited, unsteady   Right rotation   Left rotation   Shoulder flexion Right 170  Shoulder IR Right 60  Shoulder ER Right 100   Shoulder horizontal adduction Right 30   (Blank rows = not tested)   12/12/24- shoulder AROM: flexion and ABD AROM  WNL but both caused anterior R shoulder pain, FIR L WNL R to greater trochanter;  FER L T4, R to upper trap level    LOWER EXTREMITY MMT:    MMT Right eval Left eval Right 12/22/2024  Hip flexion 4+ 4+   Hip extension     Hip abduction     Hip adduction     Hip internal rotation     Hip external rotation     Knee flexion 4+ 4+   Knee extension 5 4+   Ankle dorsiflexion     Ankle plantarflexion     Ankle inversion     Ankle eversion     Shoulder ER   12.5 pounds  Shoulder IR   9.1 pounds   (Blank rows = not tested)  12/12/24- shoulder MMT: flexion 4+/5 B no pain, ABD L 4+/5, R 4-/5 anterior pain, ER and IR 4/5 B, biceps 4+/5  FUNCTIONAL TESTS:   Berg TBD (no time at eval)   TREATMENT DATE:  12/22/2024 Supine hamstrings stretch (90/90) with opposite leg straight 4 x 15 seconds Yoga Bridge 10 x 5 seconds Seated straight leg raises 4 sets of 5  Functional Activities: Pull to chest/Rows 20 x Blue Thera-Band (posture/scapular positioning to help with overhead function) Thera-Band Shoulder ER Yellow, Red and Green 5 x each, slow eccentrics Shoulder blade pinch with shrug 10 x 5 seconds (posture/scapular positioning) Thera-Band ER/IR Green 2 sets of 10 x 3 seconds slow eccentrics (posture and scapular positioning)  97535: Objective measure review; discussion on log roll/bed mobility; postural basics (avoid flexion and rotation); reviewed current HEP and additions/tweaks today   12/14/24 Access Code: [email or web address] URL: [email or web address] Date: 12/22/2024 Prepared by: Lamar Ivory  Exercises - Seated Shoulder Shrugs  - 2 x daily - 7 x weekly - 2 sets - 10 reps - 3-5 seconds hold - Seated Shoulder Row with Anchored Resistance  - 2 x daily - 7 x weekly - 2 sets - 10 reps - 2 hold - Supine Posterior Pelvic Tilt  - 2 x daily - 7 x weekly - 2 sets -  10 reps - 3 hold - Supine Bridge  - 2 x daily - 7 x weekly - 2 sets - 10 reps - Supine March  - 2 x daily - 7 x weekly - 2 sets - 10 reps - Mini Squat with Counter Support  - 2 x daily - 7 x weekly - 2 sets - 10 reps - Seated Long Arc Quad  - 2 x daily - 7 x weekly - 2 sets - 10 reps - Hooklying Hamstring Stretch with Strap  - 2 x daily - 7 x weekly - 1 sets - 4-5 reps - 10-25 hold - Seated Straight Leg Raise   - 1 x daily - 7 x weekly - 5-6 sets - 5 reps - 2 seconds hold - Shoulder External Rotation with Anchored Resistance  - 2 x daily - 7 x weekly - 1 sets - 10 reps - 3 hold - Shoulder Internal Rotation with Resistance  - 2 x daily - 7 x weekly - 1 sets - 10 reps - 3 hold   12/12/24 Eval, POC  PATIENT EDUCATION:  Education details: exam findings, POC, HEP  Person educated: Patient Education method: Explanation and Verbal cues Education comprehension: verbalized understanding, returned demonstration, and needs further education  HOME EXERCISE PROGRAM:  Access Code: [email or web address] URL: [email or web address] Date: 12/22/2024 Prepared by: Lamar Ivory  Exercises - Seated Shoulder Shrugs  - 2 x daily - 7 x weekly - 2 sets - 10 reps - 3-5 seconds hold - Seated Shoulder Row with Anchored Resistance  - 2 x daily - 7 x weekly - 2 sets - 10 reps - 2 hold - Supine Posterior Pelvic Tilt  - 2 x daily - 7 x weekly - 2 sets - 10 reps - 3 hold - Supine Bridge  - 2 x daily - 7 x weekly - 2 sets - 10 reps - Supine March  - 2 x daily - 7 x weekly - 2 sets - 10 reps - Mini Squat with Counter Support  - 2 x daily - 7 x weekly - 2 sets - 10 reps - Seated Long Arc Quad  - 2 x daily - 7 x weekly - 2 sets - 10 reps - Hooklying Hamstring Stretch with Strap  - 2 x daily - 7 x weekly - 1 sets - 4-5 reps - 10-25 hold - Seated Straight Leg Raise   - 1 x daily - 7 x weekly -  5-6 sets - 5 reps - 2 seconds hold - Shoulder External Rotation with Anchored Resistance  - 2 x daily - 7 x weekly - 1 sets - 10 reps - 3 hold - Shoulder Internal Rotation with Resistance  - 2 x daily - 7 x weekly - 1 sets - 10 reps - 3 hold  ASSESSMENT:  CLINICAL IMPRESSION: Dezaria reports good early home exercise program participation.  She did well with some minor changes to her home exercise program today to help her maintain her normal lumbar lordosis with stretching and strengthening activities.  We also made some mild modifications to her quadriceps strengthening program as she noted some slight irritation with weight-bearing strengthening.  Mitzy also had significant weakness of her right rotator cuff and progressions were made to this component of her shoulder strengthening program.  I mentioned to Margarete that she may have a rotator cuff tear and that we should objectively reassess strength in about 4 weeks to make additional recommendations in regards to her shoulder.  OBJECTIVE IMPAIRMENTS: decreased activity tolerance, decreased balance, difficulty walking, decreased ROM, decreased strength, postural dysfunction, obesity, and pain.   ACTIVITY LIMITATIONS: carrying, lifting, standing, squatting, transfers, reach over head, hygiene/grooming, and locomotion level  PARTICIPATION LIMITATIONS: meal prep, cleaning, laundry, driving, shopping, community activity, and yard work  PERSONAL FACTORS: Age, Behavior pattern, Fitness, Past/current experiences, and Time since onset of injury/illness/exacerbation are also affecting patient's functional outcome.   REHAB POTENTIAL: Fair chronicity of pain   CLINICAL DECISION MAKING: Stable/uncomplicated  EVALUATION COMPLEXITY: Low   GOALS: Goals reviewed with patient? No  SHORT TERM GOALS: Target date: 01/16/2025      Will be independent with appropriate progressive HEP  Baseline: Goal status: Ongoing 12/22/2024  2.  Sx to have improved by at  least 25% since having started PT  Baseline:  Goal status: INITIAL  3. Will score at least 48 on Berg  Goal status INITIAL      LONG TERM GOALS: Target date: 02/20/2025    MMT to have improved by at least 1 grade in all weak groups  Baseline:  Goal status: INITIAL  2.  Will be able to use R UE for all desired functional tasks and hobbies with pain no more than 2/10 Baseline:  Goal status: INITIAL  3.  Will have been able to return to regular walking program with hip/back pain no more than 2/10 Baseline:  Goal status: INITIAL  4.  Sx/complaints to have improved by at least 75% Baseline:  Goal status: INITIAL  5.  PSFS to have improved by at least 2 points  Baseline:  Goal status: INITIAL  6.  Will be able to perform all self care and home care tasks, including OH reaching with R shoulder pain no more than 2/10 Baseline:  Goal status: INITIAL  PLAN:  PT FREQUENCY: 2x/week  PT DURATION: 10 weeks  PLANNED INTERVENTIONS: 97164- PT Re-evaluation, 97750- Physical Performance Testing, 97110-Therapeutic exercises, 97530- Therapeutic activity, [id number]- Neuromuscular re-education, 97535- Self Care, and 02859- Manual therapy.  PLAN FOR NEXT SESSION: Needs initial berg score, work on lumbar and hip ROM/core strength and postural training, manual PRN, scapular and rotator cuff retraining/strengthening   Myer LELON Ivory PT, MPT 12/22/24  4:50 PM     Referring diagnosis?  Diagnosis  M25.511,G89.29 (ICD-10-CM) - Chronic right shoulder pain  G89.29,M54.42 (ICD-10-CM) - Chronic bilateral low back pain with left-sided sciatica   Treatment diagnosis? (if different than referring diagnosis) M54.59, M25.511, G89.29, M62.81, R26.81, R29.898  What was this (referring dx) caused by? []  Surgery []  Fall [x]  Ongoing issue []  Arthritis []  Other: ____________  Laterality: []  Rt []  Lt [x]  Both- R shoulder, B lumbar     Check all possible CPT codes:  *CHOOSE 10 OR LESS*    See  Planned Interventions listed in the Plan section of the Evaluation.   "

## 2024-12-26 ENCOUNTER — Ambulatory Visit

## 2024-12-26 DIAGNOSIS — R2681 Unsteadiness on feet: Secondary | ICD-10-CM | POA: Diagnosis not present

## 2024-12-26 DIAGNOSIS — M25511 Pain in right shoulder: Secondary | ICD-10-CM | POA: Diagnosis not present

## 2024-12-26 DIAGNOSIS — M6281 Muscle weakness (generalized): Secondary | ICD-10-CM

## 2024-12-26 DIAGNOSIS — G8929 Other chronic pain: Secondary | ICD-10-CM | POA: Diagnosis not present

## 2024-12-26 DIAGNOSIS — M5459 Other low back pain: Secondary | ICD-10-CM | POA: Diagnosis not present

## 2024-12-26 DIAGNOSIS — R29898 Other symptoms and signs involving the musculoskeletal system: Secondary | ICD-10-CM | POA: Diagnosis not present

## 2024-12-27 NOTE — Therapy (Signed)
 " OUTPATIENT PHYSICAL THERAPY THORACOLUMBAR TREATMENT   Patient Name: Savannah Simon MRN: [id number] DOB:10/28/1956, 69 y.o., female Today's Date: 12/28/2024  END OF SESSION:  PT End of Session - 12/28/24 1339     Visit Number 5    Number of Visits 21    Date for Recertification  02/20/25    Authorization Type [employer]    Authorization Time Period 10 visits 1/20-2/19/26    PT Start Time 1345    PT Stop Time 1423    PT Time Calculation (min) 38 min    Activity Tolerance Patient tolerated treatment well    Behavior During Therapy WFL for tasks assessed/performed            Past Medical History:  Diagnosis Date   Anxiety    situational    Hyperlipidemia    on meds-diet controlled   Hypertension    on meds   Tremor    Past Surgical History:  Procedure Laterality Date   DILATION AND CURETTAGE OF UTERUS     x3   LAPAROSCOPIC HYSTERECTOMY N/A 2008   Patient Active Problem List   Diagnosis Date Noted   Hyperlipidemia 10/27/2019   Atypical chest pain 10/27/2019   Prediabetes 09/30/2019   Essential hypertension 09/28/2019   Benign essential tremor 09/30/2017   GAD (generalized anxiety disorder) 09/30/2017   Hypercalcemia 09/30/2017   Anxiety 07/23/2016   Hyperglycemia 03/27/2015   Intermittent left-sided chest pain 03/27/2015   Gastroesophageal reflux disease without esophagitis 12/24/2014   Obesity 10/12/2013   Allergic rhinitis 03/23/2013   Cough 03/23/2013   Gastro-esophageal reflux disease with esophagitis 03/23/2013   Eustachian tube dysfunction 03/01/2013   Overweight 03/01/2013    PCP: Mercer Kirsch MD   REFERRING PROVIDER: Jule Ronal CROME, PA-C  REFERRING DIAG:  Diagnosis  M25.511,G89.29 (ICD-10-CM) - Chronic right shoulder pain  G89.29,M54.42 (ICD-10-CM) - Chronic bilateral low back pain with left-sided sciatica    Rationale for Evaluation and Treatment: Rehabilitation  THERAPY DIAG:  Other low back pain  Chronic right shoulder  pain  Muscle weakness (generalized)  Unsteadiness on feet  Other symptoms and signs involving the musculoskeletal system  ONSET DATE: shoulder about 2 years ago, back started about 10 years ago   SUBJECTIVE:                                                                                                                                                                                           SUBJECTIVE STATEMENT: [name] states she has divided up her HEP and is getting less hamstring cramping.  R shoulder hurting in the front .   PERTINENT HISTORY:  See  above, also per MD:   Objective: Vital Signs: There were no vitals taken for this visit.   Physical Exam well-developed well-nourished female in no acute distress.  Alert and oriented x 3.   Ortho Exam right shoulder exam: Forward flexion 160 degrees.  Abduction 120 degrees.  Internal rotation to back pocket.  Pain with empty can testing.  Negative speeds and negative O'Brien's.  5 out of 5 strength throughout.  She is neurovascular intact distally.  Lumbar spine exam: Tenderness to the lower lumbar spine.  No paraspinous musculature tenderness.  Increased pain with left sided straight leg raise.  Increased pain with lumbar flexion and extension.  She is neurovascular intact distally.  No focal weakness.   Specialty Comments:  No specialty comments available.   Imaging: XR Shoulder Right Result Date: 12/05/2024 Mild AC arthropathy.  Mild glenohumeral degenerative changes   XR Cervical Spine 2 or 3 views Result Date: 12/05/2024 Advanced multilevel degenerative changes   XR Lumbar Spine 2-3 Views Result Date: 12/05/2024 X-rays demonstrate advanced multilevel degenerative changes with evidence of scoliosis  PAIN:  Are you having pain? Yes: NPRS scale: 2/10 today Pain location: R shoulder, knees and low back  Pain description: low grade I'm here  Aggravating factors: over-doing, more stiff in mornings, standing and transitions from  sitting  Relieving factors: heat  PRECAUTIONS: None  RED FLAGS: None   WEIGHT BEARING RESTRICTIONS: No  FALLS:  Has patient fallen in last 6 months? No  LIVING ENVIRONMENT: Lives with: lives alone Lives in: House/apartment   OCCUPATION: retired- human resources officer   PLOF: Independent, Independent with basic ADLs, Independent with gait, and Independent with transfers  PATIENT GOALS: regain core, address pain in back/hips to be able to stand/walk longer and return to walking 3-4 miles, strengthen R arm and address pain   NEXT MD VISIT: PRN with Referring   OBJECTIVE:  Note: Objective measures were completed at Evaluation unless otherwise noted.  DIAGNOSTIC FINDINGS:  As above in PMH  PATIENT SURVEYS:  PSFS: THE PATIENT SPECIFIC FUNCTIONAL SCALE  Place score of 0-10 (0 = unable to perform activity and 10 = able to perform activity at the same level as before injury or problem)  Activity Date: 12/12/24 eval     Walking/standing comfortably  3    2.Sit to stand transitions  4    3. Using R arm for ADLs/tasks overhead/functional tasks at home  3    4.      Total Score 3.3      Total Score = Sum of activity scores/number of activities  Minimally Detectable Change: 3 points (for single activity); 2 points (for average score)  [facility] (nd). The Patient Specific Functional Scale . Retrieved from [email or web address]   COGNITION: Overall cognitive status: Within functional limits for tasks assessed     SENSATION: Not tested    POSTURE: rounded shoulders and forward head    LUMBAR ROM:   AROM eval  Flexion 25% limited   Extension WNL but unsteady   Right lateral flexion About 40% limited, unsteady   Left lateral flexion About 40% limited, unsteady   Right rotation   Left rotation   Shoulder flexion Right 170  Shoulder IR Right 60  Shoulder ER Right 100  Shoulder horizontal adduction Right  30   (Blank rows = not tested)   12/12/24- shoulder AROM: flexion and ABD AROM  WNL but both caused anterior R shoulder pain, FIR L WNL R to greater trochanter; FER L  T4, R to upper trap level    LOWER EXTREMITY MMT:    MMT Right eval Left eval Right 12/22/2024  Hip flexion 4+ 4+   Hip extension     Hip abduction     Hip adduction     Hip internal rotation     Hip external rotation     Knee flexion 4+ 4+   Knee extension 5 4+   Ankle dorsiflexion     Ankle plantarflexion     Ankle inversion     Ankle eversion     Shoulder ER   12.5 pounds  Shoulder IR   9.1 pounds   (Blank rows = not tested)  12/12/24- shoulder MMT: flexion 4+/5 B no pain, ABD L 4+/5, R 4-/5 anterior pain, ER and IR 4/5 B, biceps 4+/5  FUNCTIONAL TESTS:   Berg TBD (no time at eval)   TREATMENT DATE: shoulders, LBP, knees 12/28/24 Nustep level 5 UE/LE use  7 min  Shoulder rows TB red 3x10 Shoulder ER green walk outs 15x LAQ with 2# 2x10 bil Bridges 2x10 Hip abduction with TB in hooklying   2x10 green Supine shoulder flexion 2# bar 5x, chest press 3# bar 10x Mini squats 2x10 Tandem stance 2x15 sec each side with bar assist Wt shift with heel up for balance 2x each side   12/26/24 Nustep level 5 UE/LE use  7 min  Supine shoulder flexion 2# bar 5x, chest press 3# bar 10x Supine marching 3x10 with TA activation Hip adduction ball squeeze 10x10 sec Bridges 3x5  LAQ with 1.5# 2x10 bil Shoulder rows TB red 3x10   12/22/2024 Supine hamstrings stretch (90/90) with opposite leg straight 4 x 15 seconds Yoga Bridge 10 x 5 seconds Seated straight leg raises 4 sets of 5  Functional Activities: Pull to chest/Rows 20 x Blue Thera-Band (posture/scapular positioning to help with overhead function) Thera-Band Shoulder ER Yellow, Red and Green 5 x each, slow eccentrics Shoulder blade pinch with shrug 10 x 5 seconds (posture/scapular positioning) Thera-Band ER/IR Green 2 sets of 10 x 3 seconds slow eccentrics  (posture and scapular positioning)  97535: Objective measure review; discussion on log roll/bed mobility; postural basics (avoid flexion and rotation); reviewed current HEP and additions/tweaks today   12/14/24 Access Code: [email or web address]                                                                                                  PATIENT EDUCATION:  Education details: exam findings, POC, HEP  Person educated: Patient Education method: Explanation and Verbal cues Education comprehension: verbalized understanding, returned demonstration, and needs further education  HOME EXERCISE PROGRAM:  Access Code: [email or web address] URL: [email or web address] Date: 12/22/2024 Prepared by: Lamar Ivory  Exercises - Seated Shoulder Shrugs  - 2 x daily - 7 x weekly - 2 sets - 10 reps - 3-5 seconds hold - Seated Shoulder Row with Anchored Resistance  - 2 x daily - 7 x weekly - 2 sets - 10 reps - 2 hold - Supine Posterior Pelvic Tilt  - 2 x daily - 7 x weekly - 2 sets -  10 reps - 3 hold - Supine Bridge  - 2 x daily - 7 x weekly - 2 sets - 10 reps - Supine March  - 2 x daily - 7 x weekly - 2 sets - 10 reps - Mini Squat with Counter Support  - 2 x daily - 7 x weekly - 2 sets - 10 reps - Seated Long Arc Quad  - 2 x daily - 7 x weekly - 2 sets - 10 reps - Hooklying Hamstring Stretch with Strap  - 2 x daily - 7 x weekly - 1 sets - 4-5 reps - 10-25 hold - Seated Straight Leg Raise   - 1 x daily - 7 x weekly - 5-6 sets - 5 reps - 2 seconds hold - Shoulder External Rotation with Anchored Resistance  - 2 x daily - 7 x weekly - 1 sets - 10 reps - 3 hold - Shoulder Internal Rotation with Resistance  - 2 x daily - 7 x weekly - 1 sets - 10 reps - 3 hold  ASSESSMENT:  CLINICAL IMPRESSION: Pt needed CGA and bar use for stability with balance exercises.    OBJECTIVE IMPAIRMENTS: decreased activity tolerance, decreased balance, difficulty walking, decreased ROM, decreased strength, postural dysfunction,  obesity, and pain.   ACTIVITY LIMITATIONS: carrying, lifting, standing, squatting, transfers, reach over head, hygiene/grooming, and locomotion level  PARTICIPATION LIMITATIONS: meal prep, cleaning, laundry, driving, shopping, community activity, and yard work  PERSONAL FACTORS: Age, Behavior pattern, Fitness, Past/current experiences, and Time since onset of injury/illness/exacerbation are also affecting patient's functional outcome.   REHAB POTENTIAL: Fair chronicity of pain   CLINICAL DECISION MAKING: Stable/uncomplicated  EVALUATION COMPLEXITY: Low   GOALS: Goals reviewed with patient? No  SHORT TERM GOALS: Target date: 01/16/2025      Will be independent with appropriate progressive HEP  Baseline: Goal status: Ongoing 12/22/2024  2.  Sx to have improved by at least 25% since having started PT  Baseline:  Goal status: INITIAL  3. Will score at least 48 on Berg  Goal status INITIAL      LONG TERM GOALS: Target date: 02/20/2025    MMT to have improved by at least 1 grade in all weak groups  Baseline:  Goal status: INITIAL  2.  Will be able to use R UE for all desired functional tasks and hobbies with pain no more than 2/10 Baseline:  Goal status: INITIAL  3.  Will have been able to return to regular walking program with hip/back pain no more than 2/10 Baseline:  Goal status: INITIAL  4.  Sx/complaints to have improved by at least 75% Baseline:  Goal status: INITIAL  5.  PSFS to have improved by at least 2 points  Baseline:  Goal status: INITIAL  6.  Will be able to perform all self care and home care tasks, including OH reaching with R shoulder pain no more than 2/10 Baseline:  Goal status: INITIAL  PLAN:  PT FREQUENCY: 2x/week  PT DURATION: 10 weeks  PLANNED INTERVENTIONS: 97164- PT Re-evaluation, 97750- Physical Performance Testing, 97110-Therapeutic exercises, 97530- Therapeutic activity, [id number]- Neuromuscular re-education, 97535- Self Care, and  02859- Manual therapy.  PLAN FOR NEXT SESSION: Add to HEP prn,  work on lumbar and hip ROM/core strength and postural training, manual PRN, scapular and rotator cuff retraining/strengthening   Burnard Meth, PT 12/28/24  2:28 PM    Referring diagnosis?  Diagnosis  M25.511,G89.29 (ICD-10-CM) - Chronic right shoulder pain  G89.29,M54.42 (ICD-10-CM) - Chronic bilateral low  back pain with left-sided sciatica   Treatment diagnosis? (if different than referring diagnosis) M54.59, M25.511, G89.29, M62.81, R26.81, R29.898  What was this (referring dx) caused by? []  Surgery []  Fall [x]  Ongoing issue []  Arthritis []  Other: ____________  Laterality: []  Rt []  Lt [x]  Both- R shoulder, B lumbar     Check all possible CPT codes:  *CHOOSE 10 OR LESS*    See Planned Interventions listed in the Plan section of the Evaluation.   "

## 2024-12-28 ENCOUNTER — Ambulatory Visit

## 2024-12-28 DIAGNOSIS — R29898 Other symptoms and signs involving the musculoskeletal system: Secondary | ICD-10-CM

## 2024-12-28 DIAGNOSIS — M5459 Other low back pain: Secondary | ICD-10-CM

## 2024-12-28 DIAGNOSIS — M6281 Muscle weakness (generalized): Secondary | ICD-10-CM

## 2024-12-28 DIAGNOSIS — R2681 Unsteadiness on feet: Secondary | ICD-10-CM

## 2024-12-28 DIAGNOSIS — G8929 Other chronic pain: Secondary | ICD-10-CM

## 2025-01-01 ENCOUNTER — Encounter

## 2025-01-03 ENCOUNTER — Encounter

## 2025-01-05 ENCOUNTER — Encounter: Admitting: Rehabilitative and Restorative Service Providers"
